# Patient Record
Sex: Female | Born: 1980 | Race: White | Hispanic: No | Marital: Married | Smoking: Never smoker
Health system: Southern US, Community
[De-identification: ages and names within clinical notes are randomized; demographics above are authoritative.]

## PROBLEM LIST (undated history)

## (undated) DIAGNOSIS — N200 Calculus of kidney: Secondary | ICD-10-CM

## (undated) DIAGNOSIS — Z9889 Other specified postprocedural states: Secondary | ICD-10-CM

## (undated) DIAGNOSIS — I10 Essential (primary) hypertension: Secondary | ICD-10-CM

## (undated) DIAGNOSIS — R112 Nausea with vomiting, unspecified: Secondary | ICD-10-CM

## (undated) HISTORY — DX: Essential (primary) hypertension: I10

## (undated) HISTORY — PX: CARPAL TUNNEL RELEASE: SHX101

## (undated) HISTORY — PX: URETHRA SURGERY: SHX824

## (undated) HISTORY — PX: OTHER SURGICAL HISTORY: SHX169

---
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
## 2006-07-29 ENCOUNTER — Encounter: Payer: Self-pay | Admitting: Endocrinology

## 2006-08-02 ENCOUNTER — Encounter: Payer: Self-pay | Admitting: Endocrinology

## 2006-10-04 ENCOUNTER — Ambulatory Visit (HOSPITAL_COMMUNITY): Admission: RE | Admit: 2006-10-04 | Discharge: 2006-10-04 | Payer: Self-pay | Admitting: Family Medicine

## 2006-12-20 ENCOUNTER — Encounter: Payer: Self-pay | Admitting: Endocrinology

## 2006-12-20 ENCOUNTER — Ambulatory Visit: Payer: Self-pay | Admitting: Endocrinology

## 2006-12-20 DIAGNOSIS — G5600 Carpal tunnel syndrome, unspecified upper limb: Secondary | ICD-10-CM | POA: Insufficient documentation

## 2006-12-20 DIAGNOSIS — E282 Polycystic ovarian syndrome: Secondary | ICD-10-CM | POA: Insufficient documentation

## 2006-12-22 ENCOUNTER — Ambulatory Visit: Payer: Self-pay | Admitting: Endocrinology

## 2006-12-22 LAB — CONVERTED CEMR LAB
CO2: 25 meq/L (ref 19–32)
Chloride: 104 meq/L (ref 96–112)
Creatinine, Ser: 0.8 mg/dL (ref 0.4–1.2)
Glucose, Bld: 154 mg/dL — ABNORMAL HIGH (ref 70–99)
Sodium: 138 meq/L (ref 135–145)

## 2006-12-28 ENCOUNTER — Telehealth: Payer: Self-pay | Admitting: Endocrinology

## 2008-03-12 ENCOUNTER — Encounter (HOSPITAL_COMMUNITY): Admission: RE | Admit: 2008-03-12 | Discharge: 2008-04-11 | Payer: Self-pay | Admitting: Rheumatology

## 2008-04-18 ENCOUNTER — Ambulatory Visit (HOSPITAL_COMMUNITY): Admission: RE | Admit: 2008-04-18 | Discharge: 2008-04-18 | Payer: Self-pay | Admitting: Rheumatology

## 2009-04-08 ENCOUNTER — Inpatient Hospital Stay (HOSPITAL_COMMUNITY): Admission: AD | Admit: 2009-04-08 | Discharge: 2009-04-08 | Payer: Self-pay | Admitting: Obstetrics and Gynecology

## 2009-04-11 ENCOUNTER — Inpatient Hospital Stay (HOSPITAL_COMMUNITY): Admission: AD | Admit: 2009-04-11 | Discharge: 2009-04-14 | Payer: Self-pay | Admitting: Obstetrics and Gynecology

## 2010-05-15 LAB — CBC
HCT: 26.5 % — ABNORMAL LOW (ref 36.0–46.0)
HCT: 34.2 % — ABNORMAL LOW (ref 36.0–46.0)
Hemoglobin: 11.6 g/dL — ABNORMAL LOW (ref 12.0–15.0)
MCHC: 33.5 g/dL (ref 30.0–36.0)
MCHC: 33.9 g/dL (ref 30.0–36.0)
MCV: 88.5 fL (ref 78.0–100.0)
MCV: 89.2 fL (ref 78.0–100.0)
RBC: 2.97 MIL/uL — ABNORMAL LOW (ref 3.87–5.11)
RBC: 3.87 MIL/uL (ref 3.87–5.11)
RBC: 4.01 MIL/uL (ref 3.87–5.11)
WBC: 11.6 10*3/uL — ABNORMAL HIGH (ref 4.0–10.5)

## 2010-05-15 LAB — COMPREHENSIVE METABOLIC PANEL
ALT: 16 U/L (ref 0–35)
AST: 17 U/L (ref 0–37)
Albumin: 2.6 g/dL — ABNORMAL LOW (ref 3.5–5.2)
BUN: 10 mg/dL (ref 6–23)
CO2: 22 mEq/L (ref 19–32)
Calcium: 9.4 mg/dL (ref 8.4–10.5)
Calcium: 9.4 mg/dL (ref 8.4–10.5)
Chloride: 105 mEq/L (ref 96–112)
Creatinine, Ser: 0.67 mg/dL (ref 0.4–1.2)
Creatinine, Ser: 0.69 mg/dL (ref 0.4–1.2)
GFR calc Af Amer: >60 mL/min (ref >60)
GFR calc non Af Amer: >60 mL/min (ref >60)
Glucose, Bld: 83 mg/dL (ref 70–99)
Sodium: 134 mEq/L — ABNORMAL LOW (ref 135–145)
Total Bilirubin: 0.5 mg/dL (ref 0.3–1.2)

## 2010-05-15 LAB — LACTATE DEHYDROGENASE: LDH: 133 U/L (ref 94–250)

## 2010-05-15 LAB — URIC ACID: Uric Acid, Serum: 4.2 mg/dL (ref 2.4–7.0)

## 2010-05-15 LAB — RPR: RPR Ser Ql: NONREACTIVE

## 2010-10-13 ENCOUNTER — Other Ambulatory Visit (HOSPITAL_COMMUNITY): Payer: Self-pay | Admitting: Pediatrics

## 2010-10-16 ENCOUNTER — Ambulatory Visit (HOSPITAL_COMMUNITY)
Admission: RE | Admit: 2010-10-16 | Discharge: 2010-10-16 | Disposition: A | Discharge status: Home / Self Care | Payer: BC Managed Care – PPO | Source: Ambulatory Visit | Attending: Pediatrics | Admitting: Pediatrics

## 2010-10-16 DIAGNOSIS — R1011 Right upper quadrant pain: Secondary | ICD-10-CM | POA: Insufficient documentation

## 2010-10-16 DIAGNOSIS — K8020 Calculus of gallbladder without cholecystitis without obstruction: Secondary | ICD-10-CM | POA: Insufficient documentation

## 2010-11-05 ENCOUNTER — Encounter (HOSPITAL_COMMUNITY): Payer: Self-pay

## 2010-11-05 ENCOUNTER — Encounter (HOSPITAL_COMMUNITY)
Admission: RE | Admit: 2010-11-05 | Discharge: 2010-11-05 | Disposition: A | Discharge status: Home / Self Care | Payer: BC Managed Care – PPO | Source: Ambulatory Visit | Attending: General Surgery | Admitting: General Surgery

## 2010-11-05 HISTORY — DX: Nausea with vomiting, unspecified: R11.2

## 2010-11-05 HISTORY — DX: Other specified postprocedural states: Z98.890

## 2010-11-05 LAB — DIFFERENTIAL
Eosinophils Absolute: 0.1 10*3/uL (ref 0.0–0.7)
Eosinophils Relative: 1 % (ref 0–5)
Lymphs Abs: 2.6 10*3/uL (ref 0.7–4.0)
Monocytes Relative: 5 % (ref 3–12)

## 2010-11-05 LAB — CBC
HCT: 38.1 % (ref 36.0–46.0)
Hemoglobin: 12.5 g/dL (ref 12.0–15.0)
MCH: 28 pg (ref 26.0–34.0)
MCV: 85.2 fL (ref 78.0–100.0)
RBC: 4.47 MIL/uL (ref 3.87–5.11)

## 2010-11-05 NOTE — Patient Instructions (Addendum)
20 ELMO SHUMARD  11/05/2010   Your procedure is scheduled on:  11/07/2010  Report to [facility] at 700 AM.  Call this number if you have problems the morning of surgery: [phone]   Remember:   Do not eat food:After Midnight.  Do not drink clear liquids: After Midnight.  Take these medicines the morning of surgery with A SIP OF WATER:none   Do not wear jewelry, make-up or nail polish.  Do not wear lotions, powders, or perfumes. You may wear deodorant.  Do not shave 48 hours prior to surgery.  Do not bring valuables to the hospital.  Contacts, dentures or bridgework may not be worn into surgery.  Leave suitcase in the car. After surgery it may be brought to your room.  For patients admitted to the hospital, checkout time is 11:00 AM the day of discharge.   Patients discharged the day of surgery will not be allowed to drive home.  Name and phone number of your driver: mother in law  Special Instructions: CHG Shower Use Special Wash: 1/2 bottle night before surgery and 1/2 bottle morning of surgery.   Please read over the following fact sheets that you were given: Pain Booklet, MRSA Information, Surgical Site Infection Prevention, Anesthesia Post-op Instructions and Care and Recovery After Surgery PATIENT INSTRUCTIONS POST-ANESTHESIA  IMMEDIATELY FOLLOWING SURGERY:  Do not drive or operate machinery for the first twenty four hours after surgery.  Do not make any important decisions for twenty four hours after surgery or while taking narcotic pain medications or sedatives.  If you develop intractable nausea and vomiting or a severe headache please notify your doctor immediately.  FOLLOW-UP:  Please make an appointment with your surgeon as instructed. You do not need to follow up with anesthesia unless specifically instructed to do so.  WOUND CARE INSTRUCTIONS (if applicable):  Keep a dry clean dressing on the anesthesia/puncture wound site if there is drainage.  Once the wound has quit  draining you may leave it open to air.  Generally you should leave the bandage intact for twenty four hours unless there is drainage.  If the epidural site drains for more than 36-48 hours please call the anesthesia department.  QUESTIONS?:  Please feel free to call your physician or the hospital operator if you have any questions, and they will be happy to assist you.     [facility] Anesthesia Department [address] [place] [phone]

## 2010-11-06 NOTE — H&P (Signed)
  NTS SOAP Note  Vital Signs:  Vitals as of: 11/04/2010: Systolic 158: Diastolic 95: Heart Rate 88: Temp 98.108F: Height 58ft 6.5in: Weight 238Lbs 0 Ounces: Pain Level 6: BMI 38  BMI : 37.84 kg/m2  Subjective: This 77 Years 64 Months old Female presents forof RUQ abd pain.Started having symptoms about a month ago.  Slowly increasing. Occ diffuse but mostly RUQ and epigastric.  Colicky in nature.  Does increase with fatty foods.  Some bloating.  No emesis but occ nausea.  No change with BM although occ has loose stools.  No fevers.  No jaundice.  + family hx, +native [place] ancestry.    Review of Symptoms:  Constitutional:unremarkable Head:unremarkable Eyes:unremarkable Nose/Mouth/Throat:unremarkable Cardiovascular:unremarkable Respiratory:unremarkable Gastrointestinal:unremarkableother than HPI Genitourinary:unremarkable Musculoskeletal:unremarkable Skin:unremarkable Breast:unremarkable Hematolgic/Lymphatic:unremarkable Allergic/Immunologic:unremarkable    Past Medical History:Obtained   Past Medical History  Pregnancy Gravida:  1 Pregnancy Para:  1 Surgical History: urethral surgery, wisdom teeth, carpal tunnel and cupital tunnel release. Medical Problems: none Psychiatric History: none Allergies: NKDA Medications: none   Social History:Obtained   Social History  Preferred Language: English ([place]) Ethnicity: Not Hispanic / Latino Age: 66 Years 4 Months Marital Status:  M Alcohol:  No Recreational drug(s):  No   Smoking Status: Never smoker reviewed on 11/04/2010  Family History:Obtained   Family History  Is there a family history GE:[id number], Cancer.   Medication Allergies:   Allergies Insert Code:   Objective Information: General:Well appearing, well nourished in no distress. obese Skin:no rash or prominent lesions Head:Atraumatic; no masses; no abnormalities Eyes:conjunctiva  clear, EOM intact, PERRL Mouth:Mucous membranes moist, no mucosal lesions. Neck:Supple without lymphadenopathy.  Heart:RRR, no murmur or gallop.  Normal S1, S2.  No S3, S4.  Lungs:CTA bilaterally, no wheezes, rhonchi, rales.  Breathing unlabored. Abdomen:Soft, ND, no HSM, no masses.Mild epigastric tenderness.  No peritoneal signs.  No classic Murphy's. Extremities:No deformities, clubbing, cyanosis, or edema.   Assessment:  Diagnosis &amp; Procedure: DiagnosisCode: 574.10, ProcedureCode: 44010,   Orders:    Plan: Disscussed biliary symptoms.  Surgery discussed.  Will schedule at her convenience.   Patient Education:Alternative treatments to surgery were discussed with patient (and family).Risks and benefits  of procedure were fully explained to the patient (and family) who gave informed consent. Patient/family questions were addressed.  Follow-up:Pending Surgery                         Fabio Bering MD 11/04/2010 12:49 PM

## 2010-11-07 ENCOUNTER — Other Ambulatory Visit: Payer: Self-pay | Admitting: General Surgery

## 2010-11-07 ENCOUNTER — Encounter (HOSPITAL_COMMUNITY): Payer: Self-pay | Admitting: Anesthesiology

## 2010-11-07 ENCOUNTER — Encounter (HOSPITAL_COMMUNITY)
Admission: RE | Disposition: A | Discharge status: Home / Self Care | Payer: Self-pay | Source: Ambulatory Visit | Attending: General Surgery

## 2010-11-07 ENCOUNTER — Ambulatory Visit (HOSPITAL_COMMUNITY): Payer: BC Managed Care – PPO | Admitting: Anesthesiology

## 2010-11-07 ENCOUNTER — Ambulatory Visit (HOSPITAL_COMMUNITY)
Admission: RE | Admit: 2010-11-07 | Discharge: 2010-11-07 | Disposition: A | Discharge status: Home / Self Care | Payer: BC Managed Care – PPO | Source: Ambulatory Visit | Attending: General Surgery | Admitting: General Surgery

## 2010-11-07 ENCOUNTER — Encounter (HOSPITAL_COMMUNITY): Payer: Self-pay | Admitting: *Deleted

## 2010-11-07 DIAGNOSIS — R1013 Epigastric pain: Secondary | ICD-10-CM | POA: Insufficient documentation

## 2010-11-07 DIAGNOSIS — Z01812 Encounter for preprocedural laboratory examination: Secondary | ICD-10-CM | POA: Insufficient documentation

## 2010-11-07 DIAGNOSIS — K8020 Calculus of gallbladder without cholecystitis without obstruction: Secondary | ICD-10-CM | POA: Insufficient documentation

## 2010-11-07 DIAGNOSIS — R1011 Right upper quadrant pain: Secondary | ICD-10-CM | POA: Insufficient documentation

## 2010-11-07 HISTORY — PX: CHOLECYSTECTOMY: SHX55

## 2010-11-07 SURGERY — LAPAROSCOPIC CHOLECYSTECTOMY
Anesthesia: General | Site: Abdomen | Wound class: Clean Contaminated

## 2010-11-07 MED ORDER — PROMETHAZINE HCL 25 MG/ML IJ SOLN
6.2500 mg | Freq: Once | INTRAMUSCULAR | Status: AC
  Administered 2010-11-07: 6.25 mg via INTRAVENOUS

## 2010-11-07 MED ORDER — FENTANYL CITRATE 0.05 MG/ML IJ SOLN
INTRAMUSCULAR | Status: AC
  Filled 2010-11-07: qty 2

## 2010-11-07 MED ORDER — FENTANYL CITRATE 0.05 MG/ML IJ SOLN
INTRAMUSCULAR | Status: DC | PRN
  Administered 2010-11-07: 150 ug via INTRAVENOUS
  Administered 2010-11-07 (×2): 100 ug via INTRAVENOUS

## 2010-11-07 MED ORDER — ACETAMINOPHEN 325 MG PO TABS: 325.0000 mg | ORAL_TABLET | ORAL | Status: DC | PRN

## 2010-11-07 MED ORDER — ONDANSETRON HCL 4 MG/2ML IJ SOLN
4.0000 mg | Freq: Once | INTRAMUSCULAR | Status: AC | PRN
  Administered 2010-11-07: 4 mg via INTRAVENOUS

## 2010-11-07 MED ORDER — FENTANYL CITRATE 0.05 MG/ML IJ SOLN
INTRAMUSCULAR | Status: AC
  Administered 2010-11-07: 50 ug via INTRAVENOUS
  Filled 2010-11-07: qty 2

## 2010-11-07 MED ORDER — CEFAZOLIN SODIUM 1-5 GM-% IV SOLN
INTRAVENOUS | Status: AC
  Filled 2010-11-07: qty 50

## 2010-11-07 MED ORDER — HEMOSTATIC AGENTS (NO CHARGE) OPTIME: TOPICAL | Status: DC | PRN

## 2010-11-07 MED ORDER — MIDAZOLAM HCL 2 MG/2ML IJ SOLN
INTRAMUSCULAR | Status: AC
  Filled 2010-11-07: qty 2

## 2010-11-07 MED ORDER — HYDROCODONE-ACETAMINOPHEN 5-325 MG PO TABS: 1.0000 | ORAL_TABLET | ORAL | Status: AC | PRN

## 2010-11-07 MED ORDER — LACTATED RINGERS IV SOLN
INTRAVENOUS | Status: DC | PRN
  Administered 2010-11-07: 08:00:00 via INTRAVENOUS

## 2010-11-07 MED ORDER — SODIUM CHLORIDE 0.9 % IR SOLN
Status: DC | PRN
  Administered 2010-11-07: 1000 mL

## 2010-11-07 MED ORDER — ONDANSETRON HCL 4 MG/2ML IJ SOLN
4.0000 mg | Freq: Once | INTRAMUSCULAR | Status: AC
  Administered 2010-11-07: 4 mg via INTRAVENOUS

## 2010-11-07 MED ORDER — PROMETHAZINE HCL 25 MG/ML IJ SOLN
INTRAMUSCULAR | Status: AC
  Administered 2010-11-07: 6.25 mg via INTRAVENOUS
  Filled 2010-11-07: qty 1

## 2010-11-07 MED ORDER — CEFAZOLIN SODIUM 1-5 GM-% IV SOLN: 1.0000 g | INTRAVENOUS | Status: DC

## 2010-11-07 MED ORDER — ROCURONIUM BROMIDE 100 MG/10ML IV SOLN
INTRAVENOUS | Status: DC | PRN
  Administered 2010-11-07: 35 mg via INTRAVENOUS

## 2010-11-07 MED ORDER — CELECOXIB 100 MG PO CAPS
ORAL_CAPSULE | ORAL | Status: AC
  Administered 2010-11-07: 400 mg via ORAL
  Filled 2010-11-07: qty 4

## 2010-11-07 MED ORDER — ENOXAPARIN SODIUM 40 MG/0.4ML ~~LOC~~ SOLN
40.0000 mg | Freq: Once | SUBCUTANEOUS | Status: AC
  Administered 2010-11-07: 40 mg via SUBCUTANEOUS

## 2010-11-07 MED ORDER — BUPIVACAINE HCL (PF) 0.5 % IJ SOLN
INTRAMUSCULAR | Status: DC | PRN
  Administered 2010-11-07: 10 mL

## 2010-11-07 MED ORDER — CEFAZOLIN SODIUM 1-5 GM-% IV SOLN
INTRAVENOUS | Status: DC | PRN
  Administered 2010-11-07: 1 g via INTRAVENOUS

## 2010-11-07 MED ORDER — LACTATED RINGERS IV SOLN: INTRAVENOUS | Status: DC

## 2010-11-07 MED ORDER — GLYCOPYRROLATE 0.2 MG/ML IJ SOLN
0.2000 mg | Freq: Once | INTRAMUSCULAR | Status: AC | PRN
  Administered 2010-11-07: 0.2 mg via INTRAVENOUS

## 2010-11-07 MED ORDER — FENTANYL CITRATE 0.05 MG/ML IJ SOLN
INTRAMUSCULAR | Status: AC
  Administered 2010-11-07: 50 ug via INTRAVENOUS
  Filled 2010-11-07: qty 5

## 2010-11-07 MED ORDER — GLYCOPYRROLATE 0.2 MG/ML IJ SOLN
INTRAMUSCULAR | Status: AC
  Filled 2010-11-07: qty 1

## 2010-11-07 MED ORDER — FENTANYL CITRATE 0.05 MG/ML IJ SOLN
25.0000 ug | INTRAMUSCULAR | Status: DC | PRN
  Administered 2010-11-07 (×3): 50 ug via INTRAVENOUS

## 2010-11-07 MED ORDER — CELECOXIB 100 MG PO CAPS
400.0000 mg | ORAL_CAPSULE | Freq: Every day | ORAL | Status: AC
  Administered 2010-11-07: 400 mg via ORAL

## 2010-11-07 MED ORDER — GLYCOPYRROLATE 0.2 MG/ML IJ SOLN
0.2000 mg | Freq: Once | INTRAMUSCULAR | Status: AC
  Administered 2010-11-07: 0.2 mg via INTRAVENOUS

## 2010-11-07 MED ORDER — LACTATED RINGERS IV SOLN
INTRAVENOUS | Status: DC
  Administered 2010-11-07: 08:00:00 via INTRAVENOUS

## 2010-11-07 MED ORDER — LIDOCAINE HCL 1 % IJ SOLN
INTRAMUSCULAR | Status: DC | PRN
  Administered 2010-11-07: 50 mg via INTRADERMAL

## 2010-11-07 MED ORDER — ONDANSETRON HCL 4 MG/2ML IJ SOLN
INTRAMUSCULAR | Status: AC
  Administered 2010-11-07: 4 mg via INTRAVENOUS
  Filled 2010-11-07: qty 2

## 2010-11-07 MED ORDER — PROPOFOL 10 MG/ML IV EMUL
INTRAVENOUS | Status: DC | PRN
  Administered 2010-11-07: 150 mg via INTRAVENOUS

## 2010-11-07 MED ORDER — ONDANSETRON HCL 4 MG/2ML IJ SOLN
INTRAMUSCULAR | Status: AC
  Filled 2010-11-07: qty 2

## 2010-11-07 MED ORDER — GLYCOPYRROLATE 0.2 MG/ML IJ SOLN
INTRAMUSCULAR | Status: DC | PRN
  Administered 2010-11-07: .4 mg via INTRAVENOUS

## 2010-11-07 MED ORDER — MIDAZOLAM HCL 2 MG/2ML IJ SOLN
1.0000 mg | INTRAMUSCULAR | Status: DC | PRN
  Administered 2010-11-07: 2 mg via INTRAVENOUS

## 2010-11-07 MED ORDER — MIDAZOLAM HCL 5 MG/5ML IJ SOLN
INTRAMUSCULAR | Status: DC | PRN
  Administered 2010-11-07: 2 mg via INTRAVENOUS

## 2010-11-07 MED ORDER — NEOSTIGMINE METHYLSULFATE 1 MG/ML IJ SOLN
INTRAMUSCULAR | Status: DC | PRN
  Administered 2010-11-07: 3 mg via INTRAMUSCULAR

## 2010-11-07 MED ORDER — BUPIVACAINE HCL (PF) 0.5 % IJ SOLN
INTRAMUSCULAR | Status: AC
  Filled 2010-11-07: qty 30

## 2010-11-07 MED ORDER — GLYCOPYRROLATE 0.2 MG/ML IJ SOLN
INTRAMUSCULAR | Status: AC
  Administered 2010-11-07: 0.2 mg via INTRAVENOUS
  Filled 2010-11-07: qty 1

## 2010-11-07 SURGICAL SUPPLY — 36 items
APPLIER CLIP UNV 5X34 EPIX (ENDOMECHANICALS) ×2 IMPLANT
BAG HAMPER (MISCELLANEOUS) ×2 IMPLANT
BENZOIN TINCTURE PRP APPL 2/3 (GAUZE/BANDAGES/DRESSINGS) ×2 IMPLANT
CLOTH BEACON ORANGE TIMEOUT ST (SAFETY) ×2 IMPLANT
COVER LIGHT HANDLE STERIS (MISCELLANEOUS) ×4 IMPLANT
DECANTER SPIKE VIAL GLASS SM (MISCELLANEOUS) ×2 IMPLANT
DEVICE TROCAR PUNCTURE CLOSURE (ENDOMECHANICALS) ×2 IMPLANT
DURAPREP 26ML APPLICATOR (WOUND CARE) ×2 IMPLANT
ELECT REM PT RETURN 9FT ADLT (ELECTROSURGICAL) ×2
ELECTRODE REM PT RTRN 9FT ADLT (ELECTROSURGICAL) ×1 IMPLANT
FILTER SMOKE EVAC LAPAROSHD (FILTER) ×2 IMPLANT
FORMALIN 10 PREFIL 120ML (MISCELLANEOUS) ×2 IMPLANT
GLOVE BIOGEL PI IND STRL 7.5 (GLOVE) ×1 IMPLANT
GLOVE BIOGEL PI INDICATOR 7.5 (GLOVE) ×1
GLOVE ECLIPSE 6.5 STRL STRAW (GLOVE) ×2 IMPLANT
GLOVE ECLIPSE 7.0 STRL STRAW (GLOVE) ×4 IMPLANT
GLOVE EXAM NITRILE MD LF STRL (GLOVE) ×2 IMPLANT
GLOVE INDICATOR 7.0 STRL GRN (GLOVE) ×2 IMPLANT
GLOVE INDICATOR 7.5 STRL GRN (GLOVE) ×2 IMPLANT
GOWN BRE IMP SLV AUR XL STRL (GOWN DISPOSABLE) ×6 IMPLANT
HEMOSTAT SNOW SURGICEL 2X4 (HEMOSTASIS) ×2 IMPLANT
INST SET LAPROSCOPIC AP (KITS) ×2 IMPLANT
IV NS IRRIG 3000ML ARTHROMATIC (IV SOLUTION) ×2 IMPLANT
KIT ROOM TURNOVER APOR (KITS) ×2 IMPLANT
KIT TROCAR LAP CHOLE (TROCAR) ×2 IMPLANT
MANIFOLD NEPTUNE II (INSTRUMENTS) ×2 IMPLANT
PACK LAP CHOLE LZT030E (CUSTOM PROCEDURE TRAY) ×2 IMPLANT
PAD ARMBOARD 7.5X6 YLW CONV (MISCELLANEOUS) ×2 IMPLANT
POUCH SPECIMEN RETRIEVAL 10MM (ENDOMECHANICALS) ×2 IMPLANT
SET BASIN LINEN APH (SET/KITS/TRAYS/PACK) ×2 IMPLANT
SET TUBE IRRIG SUCTION NO TIP (IRRIGATION / IRRIGATOR) ×2 IMPLANT
SLEEVE Z-THREAD 5X100MM (TROCAR) ×2 IMPLANT
STRIP CLOSURE SKIN 1/2X4 (GAUZE/BANDAGES/DRESSINGS) ×2 IMPLANT
SUT MNCRL AB 4-0 PS2 18 (SUTURE) ×4 IMPLANT
SUT VIC AB 2-0 CT2 27 (SUTURE) ×2 IMPLANT
WARMER LAPAROSCOPE (MISCELLANEOUS) ×2 IMPLANT

## 2010-11-07 NOTE — Interval H&P Note (Signed)
History and Physical Interval Note:   11/07/2010   8:51 AM   Emily Colon  has presented today for surgery, with the diagnosis of Cholelithiasis [574.20]  The various methods of treatment have been discussed with the patient and family. After consideration of risks, benefits and other options for treatment, the patient has consented to  Procedure(s): LAPAROSCOPIC CHOLECYSTECTOMY as a surgical intervention .  I have reviewed the patients' chart and labs.  Questions were answered to the patient's satisfaction.     Fabio Bering  MD

## 2010-11-07 NOTE — Addendum Note (Signed)
Addendum  created 11/07/10 1303 by Roselie Awkward, MD   Modules edited:Orders

## 2010-11-07 NOTE — Anesthesia Procedure Notes (Addendum)
Procedure Name: Intubation Date/Time: 11/07/2010 9:18 AM Performed by: Despina Hidden Pre-anesthesia Checklist: Patient identified, Patient being monitored, Timeout performed, Emergency Drugs available and Suction available Patient Re-evaluated:Patient Re-evaluated prior to inductionOxygen Delivery Method: Circle System Utilized Preoxygenation: Pre-oxygenation with 100% oxygen Intubation Type: IV induction Ventilation: Mask ventilation without difficulty Laryngoscope Size: Mac and 3 Grade View: Grade I Tube type: Oral Airway Equipment and Method: stylet Placement Confirmation: ETT inserted through vocal cords under direct vision,  breath sounds checked- equal and bilateral and positive ETCO2 Dental Injury: Teeth and Oropharynx as per pre-operative assessment

## 2010-11-07 NOTE — Op Note (Signed)
Patient:  Emily Colon  DOB:  Dec 08, 1980  MRN:  [id number]   Preop Diagnosis:  Cholelithiasis   Postop Diagnosis:  The same  Procedure:  Laparoscopic cholecystectomy  Surgeon:  Dr. Tilford Pillar  Anes:  General endotracheal  Indications:  Patient is a 30 year old female presented my office with a history right upper quadrant abdominal pain workup was consistent for cholelithiasis and biliary etiology. Risks benefits alternatives of a laparoscopic possible open cholecystectomy were discussed at length with the patient including but not limited to risk of bleeding, infection, bile leak, small bowel injury, common bile duct injury, intraoperative cardiac and pulmonary events. Patient's questions and concerns were addressed the patient was consented for the planned procedure.  Procedure note:  Patient is taken to the OR was placed in a supine position on the OR table. This point general anesthetic was administered and was patient was asleep she was endotracheally intubated by the nurse anesthetist. At this point her abdomen is prepped with DuraPrep solution and draped in standard fashion. A stab incision was created supraumbilically with 11 blade scalpel additional dissection down to subcuticular tissues carried out using a Coker clamp was utilized to grasp the anterior abdominal wall fascia and lift this anteriorly. A Veress needle was inserted saline drop test was utilized to confirm intraperitoneal placement and then pneumoperitoneum was initiated. Once sufficient pneumoperitoneum was obtained an 11 mm trocar was inserted over a laparoscope allowing visualization the trocar entering into the peritoneal cavity. At this point the inner cannula was removed the laparoscope was reinserted there is no evidence a Veress or trocar placement injury. At this point the remaining trochars replaced with a 5 mm in the epigastrium a 5 mm trocar in the midline and a 5 mm trocar in the right lateral abdominal  wall. Patient is positioned into a reverse Trendelenburg left lateral decubitus position. The fundus of the gallbladder was grasped and lifted up and over the right lobe the liver. Blunt peritoneal dissection is carried out using a [place] dissector to strip the peritoneum off the infundibulum exposing the cystic duct and cystic artery is or entering into the infundibulum. A window was created behind both the structures. 3 endoclips were placed proximally one distally and the cystic duct and the cystic duct was divided between 2 most clips. Similarly 2 endoclips placed proximally one distally and the cystic artery and the cystic artery was divided between 2 most distal clips. At this point electrocautery was utilized to dissect the gallbladder free from the gallbladder fossa. Once it is free is placed into the Endo Catch bag and placed into the right lower quadrant. Inspection of the gallbladder fossa demonstrated excellent hemostasis having been attainable after cautery. The endoclips were inspected there is no evidence of any bleeding or bile leak. At this point attention was turned to closure.  The 10 mm scope is exchanged for a 5 mm scope. A Endo Close suture passing device utilized to pass a 2-0 Vicryl through the umbilical trocar site. With this suture in place the gallbladder was retrieved and was removed through the umbilical trocar site and intact Endo Catch bag. To note some blunt dilatation was required to adequately enlarged the umbilical trocar site to remove the gallbladder. At this point the pneumoperitoneum was evacuated. The trochars were removed. The Vicryl sutures secured. Local anesthetic is instilled. A 4-0 Monocryl utilized to reapproximate the skin edges at all 4 trocar sites. The skin was washed dried moist dry towel. Benzoin is applied around the  incisions. And Steri-Strips are placed. The drapes were removed. The patient was allowed to come out of general anesthetic is transferred to the  PACU in stable condition. At the conclusion of procedure all instrument sponge and needle counts are correct. Patient tolerated procedure extremely well.  Complications:  None  EBL:  Scant  Specimen:  Gallbladder

## 2010-11-07 NOTE — Anesthesia Postprocedure Evaluation (Signed)
  Anesthesia Post-op Note  Patient: Emily Colon  Procedure(s) Performed:  LAPAROSCOPIC CHOLECYSTECTOMY  Patient Location: PACU  Anesthesia Type: General  Level of Consciousness: awake, alert , oriented and patient cooperative  Airway and Oxygen Therapy: Patient Spontanous Breathing and Patient connected to face mask oxygen  Post-op Pain: 2 /10, mild  Post-op Assessment: Post-op Vital signs reviewed, Patient's Cardiovascular Status Stable, Respiratory Function Stable, Patent Airway, No signs of Nausea or vomiting and Pain level controlled  Post-op Vital Signs: Reviewed and stable  Complications: No apparent anesthesia complications

## 2010-11-07 NOTE — Transfer of Care (Signed)
Immediate Anesthesia Transfer of Care Note  Patient: Emily Colon  Procedure(s) Performed:  LAPAROSCOPIC CHOLECYSTECTOMY  Patient Location: PACU  Anesthesia Type: General  Level of Consciousness: awake, alert , oriented and patient cooperative  Airway & Oxygen Therapy: Patient Spontanous Breathing and Patient connected to face mask oxygen  Post-op Assessment: Report given to PACU RN, Post -op Vital signs reviewed and stable, Patient moving all extremities and Patient moving all extremities X 4  Post vital signs: Reviewed and stable  Complications: No apparent anesthesia complications

## 2010-11-07 NOTE — Anesthesia Preprocedure Evaluation (Signed)
Anesthesia Evaluation  Name, MR# and DOB Patient awake  General Assessment Comment  Reviewed: Allergy & Precautions, H&P , NPO status , Patient's Chart, lab work & pertinent test results  History of Anesthesia Complications (+) PONV  Airway Mallampati: II TM Distance: >3 FB Neck ROM: Full    Dental No notable dental hx.    Pulmonary    pulmonary exam normalPulmonary Exam Normal     Cardiovascular Regular Normal    Neuro/Psych  Neuromuscular disease   GI/Hepatic/Renal        GERD Medicated     Endo/Other  (+)   Morbid obesity  Abdominal Normal abdominal exam  (+)   Musculoskeletal   Hematology   Peds  Reproductive/Obstetrics negative OB ROS    Anesthesia Other Findings             Anesthesia Physical Anesthesia Plan  ASA: II  Anesthesia Plan: General   Post-op Pain Management:    Induction: Intravenous, Rapid sequence and Cricoid pressure planned  Airway Management Planned: Oral ETT  Additional Equipment:   Intra-op Plan:   Post-operative Plan: Extubation in OR  Informed Consent: I have reviewed the patients History and Physical, chart, labs and discussed the procedure including the risks, benefits and alternatives for the proposed anesthesia with the patient or authorized representative who has indicated his/her understanding and acceptance.     Plan Discussed with: CRNA  Anesthesia Plan Comments:         Anesthesia Quick Evaluation

## 2010-11-13 ENCOUNTER — Encounter (HOSPITAL_COMMUNITY): Payer: Self-pay | Admitting: General Surgery

## 2012-03-17 ENCOUNTER — Other Ambulatory Visit: Payer: Self-pay | Admitting: Obstetrics and Gynecology

## 2012-03-17 DIAGNOSIS — N644 Mastodynia: Secondary | ICD-10-CM

## 2012-03-28 ENCOUNTER — Ambulatory Visit
Admission: RE | Admit: 2012-03-28 | Discharge: 2012-03-28 | Disposition: A | Discharge status: Home / Self Care | Payer: BC Managed Care – PPO | Source: Ambulatory Visit | Attending: Obstetrics and Gynecology | Admitting: Obstetrics and Gynecology

## 2012-03-28 DIAGNOSIS — N644 Mastodynia: Secondary | ICD-10-CM

## 2012-08-11 ENCOUNTER — Encounter: Payer: Self-pay | Admitting: Pediatrics

## 2012-12-29 ENCOUNTER — Other Ambulatory Visit: Payer: Self-pay

## 2014-03-01 ENCOUNTER — Other Ambulatory Visit: Payer: Self-pay | Admitting: Rheumatology

## 2014-03-01 ENCOUNTER — Ambulatory Visit
Admission: RE | Admit: 2014-03-01 | Discharge: 2014-03-01 | Disposition: A | Discharge status: Home / Self Care | Payer: BC Managed Care – PPO | Source: Ambulatory Visit | Attending: Rheumatology | Admitting: Rheumatology

## 2014-03-01 DIAGNOSIS — M545 Low back pain: Secondary | ICD-10-CM

## 2014-09-10 ENCOUNTER — Other Ambulatory Visit: Payer: Self-pay | Admitting: Obstetrics and Gynecology

## 2014-09-11 LAB — CYTOLOGY - PAP

## 2016-03-25 ENCOUNTER — Telehealth: Payer: Self-pay

## 2016-03-25 NOTE — Telephone Encounter (Signed)
Pt was referred by Dr. Dwana MelenaZack Hall for colonoscopy ( family hx of colon cancer). LFTs elevated ( AST 37 and ALT and ALT 65).  LMOM to call and schedule an OV prior to scheduling a colonoscopy.

## 2016-03-27 NOTE — Telephone Encounter (Signed)
PT has OV appt on 04/15/2016 at 8:00 Am with Wynne DustEric Gill, NP.

## 2016-04-15 ENCOUNTER — Other Ambulatory Visit: Payer: Self-pay

## 2016-04-15 ENCOUNTER — Encounter: Payer: Self-pay | Admitting: Nurse Practitioner

## 2016-04-15 ENCOUNTER — Ambulatory Visit (INDEPENDENT_AMBULATORY_CARE_PROVIDER_SITE_OTHER): Payer: BC Managed Care – PPO | Admitting: Nurse Practitioner

## 2016-04-15 DIAGNOSIS — R945 Abnormal results of liver function studies: Principal | ICD-10-CM

## 2016-04-15 DIAGNOSIS — Z8371 Family history of colonic polyps: Secondary | ICD-10-CM

## 2016-04-15 DIAGNOSIS — K625 Hemorrhage of anus and rectum: Secondary | ICD-10-CM | POA: Diagnosis not present

## 2016-04-15 DIAGNOSIS — Z83719 Family history of colon polyps, unspecified: Secondary | ICD-10-CM

## 2016-04-15 DIAGNOSIS — R7989 Other specified abnormal findings of blood chemistry: Secondary | ICD-10-CM

## 2016-04-15 LAB — COMPREHENSIVE METABOLIC PANEL
ALBUMIN: 3.9 g/dL (ref 3.6–5.1)
ALK PHOS: 54 U/L (ref 33–115)
ALT: 64 U/L — AB (ref 6–29)
AST: 39 U/L — ABNORMAL HIGH (ref 10–30)
BUN: 13 mg/dL (ref 7–25)
CO2: 25 mmol/L (ref 20–31)
CREATININE: 0.83 mg/dL (ref 0.50–1.10)
Calcium: 9.3 mg/dL (ref 8.6–10.2)
Chloride: 103 mmol/L (ref 98–110)
Glucose, Bld: 99 mg/dL (ref 65–99)
POTASSIUM: 4.4 mmol/L (ref 3.5–5.3)
Sodium: 138 mmol/L (ref 135–146)
TOTAL PROTEIN: 7 g/dL (ref 6.1–8.1)
Total Bilirubin: 0.5 mg/dL (ref 0.2–1.2)

## 2016-04-15 MED ORDER — PEG 3350-KCL-NA BICARB-NACL 420 G PO SOLR: 4000.0000 mL | ORAL | 0 refills | Status: DC

## 2016-04-15 NOTE — Assessment & Plan Note (Signed)
The patient has mild elevation in LFTs including AST/ALT of 37/65. She also has hypertension and borderline diabetes with a hemoglobin A1c of 5.9. She is also morbidly obese. Most likely etiology is fatty infiltration of the liver. However, I will recheck CMP as well as hepatitis viruses. May consider autoimmune workup due to family history of "immune issues". I will also do a right upper quadrant ultrasound to evaluate for any significant liver changes.

## 2016-04-15 NOTE — Patient Instructions (Signed)
1. Have your labs drawn when you're able to. 2. We will help you schedule your abdominal ultrasound of your liver. 3. We will schedule your colonoscopy procedure for you. 4. Return for follow-up in 2 months to discuss results and any next steps.

## 2016-04-15 NOTE — Progress Notes (Addendum)
REVIEWED-NO ADDITIONAL RECOMMENDATIONS.Marland Kitchen  Primary Care Physician:  Dwana Melena, MD Primary Gastroenterologist:  Dr. Darrick Penna  Chief Complaint  Patient presents with  . Colonoscopy    HPI:   Emily Colon is a 36 y.o. female who presents on referral from primary care to consider colonoscopy as well as for elevated LFTs. PCP notes reviewed. Labs included reviewed. AST/ALT with minimal elevation of 37/65, hemoglobin A1c also borderline diabetic at 5.9.  Today she states she's doing well overall. Her sister recently had a colonoscopy and her sister had a particularly large polyp which was "precancerous" and her sister's GI recommended all siblings undergo colonoscopy now for initial screening/high risk. Her dad also had several polyps removed. Her sister also has a benign liver cyst (per MRI). Has been told she has a gluten allergy (per serology) and has been gluten free for a couple years but difficult when eating out and will get some abdominal pain, bloating with this. Has intermittent left-sided pain, intermittent, ranges from irritating to "more noticeable than others" and typically resolves on its own. Rare RUQ irritation s/p cholecystectomy. Tends to have more diarrhea after cholecystectomy. Denies persistent N/V. Occasional rectal bleeding about "once in a blue moon." Is also having esophageal burning and was given Nexium for two weeks which helped and is due for follow-up on this. Denies dysphagia. Denies melena, fever, chills, unintentional weight loss. Denies chest pain, dyspnea, dizziness, lightheadedness, syncope, near syncope. Denies any other upper or lower GI symptoms.  Denies drug use/injection drug use. Takes Aleve once every couple weeks. No ASA powders.  Past Medical History:  Diagnosis Date  . Hypertension   . PONV (postoperative nausea and vomiting)     Past Surgical History:  Procedure Laterality Date  . CARPAL TUNNEL RELEASE  2005 and 2006   bilateral    also cubital  tunnel  . CHOLECYSTECTOMY  11/07/2010   Procedure: LAPAROSCOPIC CHOLECYSTECTOMY;  Surgeon: Fabio Bering;  Location: [facility];  Service: General;  Laterality: N/A;  . URETHRA SURGERY  age 4  . WISDOM TOOTH EXTRACTION  2001    Current Outpatient Prescriptions  Medication Sig Dispense Refill  . CAMILA 0.35 MG tablet     . cyclobenzaprine (FLEXERIL) 10 MG tablet Take 10 mg by mouth 3 (three) times daily as needed for muscle spasms.    Marland Kitchen losartan (COZAAR) 100 MG tablet Take 100 mg by mouth daily.    . nebivolol (BYSTOLIC) 5 MG tablet Take 5 mg by mouth daily.     No current facility-administered medications for this visit.     Allergies as of 04/15/2016  . (No Known Allergies)    Family History  Problem Relation Age of Onset  . Anesthesia problems Neg Hx   . Hypotension Neg Hx   . Malignant hyperthermia Neg Hx   . Pseudochol deficiency Neg Hx   . Colon cancer Neg Hx     Social History   Social History  . Marital status: Married    Spouse name: N/A  . Number of children: N/A  . Years of education: N/A   Occupational History  . Not on file.   Social History Main Topics  . Smoking status: Never Smoker  . Smokeless tobacco: Never Used  . Alcohol use No  . Drug use: No  . Sexual activity: Yes    Birth control/ protection: Implant   Other Topics Concern  . Not on file   Social History Narrative  . No narrative on file  Review of Systems: Complete ROS negative except as per HPI.    Physical Exam: BP (!) 161/107   Pulse 73   Temp 97.8 F (36.6 C) (Oral)   Ht 5\' 7"  (1.702 m)   Wt 275 lb (124.7 kg)   LMP 03/25/2016   BMI 43.07 kg/m  General:   Obese female. Alert and oriented. Pleasant and cooperative. Well-nourished and well-developed.  Head:  Normocephalic and atraumatic. Eyes:  Without icterus, sclera clear and conjunctiva pink.  Ears:  Normal auditory acuity. Cardiovascular:  S1, S2 present without murmurs appreciated. Extremities without clubbing or  edema. Respiratory:  Clear to auscultation bilaterally. No wheezes, rales, or rhonchi. No distress.  Gastrointestinal:  +BS, rounded but soft, and non-distended. Mild left-sided TTP and epigastric TTP. No HSM noted. No guarding or rebound. No masses appreciated.  Rectal:  Deferred  Musculoskalatal:  Symmetrical without gross deformities. Neurologic:  Alert and oriented x4;  grossly normal neurologically. Psych:  Alert and cooperative. Normal mood and affect. Heme/Lymph/Immune: No excessive bruising noted.    04/15/2016 8:43 AM   Disclaimer: This note was dictated with voice recognition software. Similar sounding words can inadvertently be transcribed and may not be corrected upon review.

## 2016-04-15 NOTE — Assessment & Plan Note (Signed)
The patient notes occasional rectal bleeding "[facility] "which is typically on the stool and in the water. Family history of colon polyps as per above. We will proceed with colonoscopy as per above.

## 2016-04-15 NOTE — Progress Notes (Signed)
cc'ed to pcp °

## 2016-04-15 NOTE — Assessment & Plan Note (Signed)
The patient states her sister recently had a moderate to large size polyp removed which is deemed precancerous. Her sisters gastroenterologist recommended all siblings be evaluated with a colonoscopy. Additionally, her father had several polyps. She does have some left-sided abdominal tenderness and rectal bleeding as well. Because of rectal bleeding and family history we will proceed with colonoscopy at this time.  Proceed with colonoscopy with Dr. Darrick PennaFields in the near future. The risks, benefits, and alternatives have been discussed in detail with the patient. They state understanding and desire to proceed.   The patient is not on any anticoagulants, anxiolytics, chronic pain medications, or antidepressants. Conscious sedation should be adequate for her procedure.

## 2016-04-16 LAB — HEPATITIS C ANTIBODY: HCV Ab: NEGATIVE

## 2016-04-16 LAB — HEPATITIS B CORE ANTIBODY, TOTAL: HEP B C TOTAL AB: NONREACTIVE

## 2016-04-16 LAB — HEPATITIS B SURFACE ANTIBODY,QUALITATIVE: HEP B S AB: POSITIVE — AB

## 2016-04-16 LAB — HEPATITIS A ANTIBODY, TOTAL: HEP A TOTAL AB: NONREACTIVE

## 2016-04-16 LAB — HEPATITIS B SURFACE ANTIGEN: HEP B S AG: NEGATIVE

## 2016-04-22 ENCOUNTER — Ambulatory Visit (HOSPITAL_COMMUNITY)
Admission: RE | Admit: 2016-04-22 | Discharge: 2016-04-22 | Disposition: A | Discharge status: Home / Self Care | Payer: BC Managed Care – PPO | Source: Ambulatory Visit | Attending: Nurse Practitioner | Admitting: Nurse Practitioner

## 2016-04-22 DIAGNOSIS — R7989 Other specified abnormal findings of blood chemistry: Secondary | ICD-10-CM | POA: Diagnosis present

## 2016-04-22 DIAGNOSIS — R945 Abnormal results of liver function studies: Secondary | ICD-10-CM

## 2016-04-22 DIAGNOSIS — Z9049 Acquired absence of other specified parts of digestive tract: Secondary | ICD-10-CM | POA: Insufficient documentation

## 2016-04-24 ENCOUNTER — Telehealth: Payer: Self-pay | Admitting: Gastroenterology

## 2016-04-24 NOTE — Telephone Encounter (Signed)
PATIENT HAS A QUESTION ABOUT HER BLOODWORK THAT SHE CAN SEE ON MY CHART. [phone]

## 2016-04-27 NOTE — Progress Notes (Signed)
PT is aware of results.  

## 2016-04-27 NOTE — Telephone Encounter (Signed)
LMOM to call.

## 2016-04-28 NOTE — Telephone Encounter (Signed)
Pt is aware of her results. Also, she wanted to make sure she is still scheduled for the colonoscopy on Thursday, since it does not show up in appts.

## 2016-04-30 ENCOUNTER — Ambulatory Visit (HOSPITAL_COMMUNITY)
Admission: RE | Admit: 2016-04-30 | Discharge: 2016-04-30 | Disposition: A | Discharge status: Home / Self Care | Payer: BC Managed Care – PPO | Source: Ambulatory Visit | Attending: Gastroenterology | Admitting: Gastroenterology

## 2016-04-30 ENCOUNTER — Encounter (HOSPITAL_COMMUNITY): Payer: Self-pay | Admitting: *Deleted

## 2016-04-30 ENCOUNTER — Encounter (HOSPITAL_COMMUNITY)
Admission: RE | Disposition: A | Discharge status: Home / Self Care | Payer: Self-pay | Source: Ambulatory Visit | Attending: Gastroenterology

## 2016-04-30 DIAGNOSIS — Z1212 Encounter for screening for malignant neoplasm of rectum: Secondary | ICD-10-CM

## 2016-04-30 DIAGNOSIS — Q438 Other specified congenital malformations of intestine: Secondary | ICD-10-CM | POA: Insufficient documentation

## 2016-04-30 DIAGNOSIS — K648 Other hemorrhoids: Secondary | ICD-10-CM | POA: Insufficient documentation

## 2016-04-30 DIAGNOSIS — Z800 Family history of malignant neoplasm of digestive organs: Secondary | ICD-10-CM | POA: Insufficient documentation

## 2016-04-30 DIAGNOSIS — Z79899 Other long term (current) drug therapy: Secondary | ICD-10-CM | POA: Insufficient documentation

## 2016-04-30 DIAGNOSIS — Z1211 Encounter for screening for malignant neoplasm of colon: Secondary | ICD-10-CM | POA: Insufficient documentation

## 2016-04-30 DIAGNOSIS — Z8371 Family history of colonic polyps: Secondary | ICD-10-CM | POA: Insufficient documentation

## 2016-04-30 DIAGNOSIS — I10 Essential (primary) hypertension: Secondary | ICD-10-CM | POA: Insufficient documentation

## 2016-04-30 DIAGNOSIS — K621 Rectal polyp: Secondary | ICD-10-CM

## 2016-04-30 DIAGNOSIS — K625 Hemorrhage of anus and rectum: Secondary | ICD-10-CM

## 2016-04-30 HISTORY — DX: Calculus of kidney: N20.0

## 2016-04-30 HISTORY — PX: COLONOSCOPY: SHX5424

## 2016-04-30 SURGERY — COLONOSCOPY
Anesthesia: Moderate Sedation

## 2016-04-30 MED ORDER — MEPERIDINE HCL 100 MG/ML IJ SOLN
INTRAMUSCULAR | Status: DC | PRN
  Administered 2016-04-30 (×3): 25 mg

## 2016-04-30 MED ORDER — SODIUM CHLORIDE 0.9 % IV SOLN
INTRAVENOUS | Status: DC
  Administered 2016-04-30: 09:00:00 via INTRAVENOUS

## 2016-04-30 MED ORDER — MEPERIDINE HCL 100 MG/ML IJ SOLN
INTRAMUSCULAR | Status: DC
Start: 2016-04-30 — End: 2016-04-30
  Filled 2016-04-30: qty 2

## 2016-04-30 MED ORDER — MIDAZOLAM HCL 5 MG/5ML IJ SOLN
INTRAMUSCULAR | Status: DC | PRN
  Administered 2016-04-30: 1 mg via INTRAVENOUS
  Administered 2016-04-30 (×3): 2 mg via INTRAVENOUS

## 2016-04-30 MED ORDER — MIDAZOLAM HCL 5 MG/5ML IJ SOLN
INTRAMUSCULAR | Status: AC
  Filled 2016-04-30: qty 10

## 2016-04-30 MED ORDER — ONDANSETRON HCL 4 MG/2ML IJ SOLN
INTRAMUSCULAR | Status: DC | PRN
Start: 2016-04-30 — End: 2016-04-30
  Administered 2016-04-30: 4 mg via INTRAVENOUS

## 2016-04-30 MED ORDER — ONDANSETRON HCL 4 MG/2ML IJ SOLN
INTRAMUSCULAR | Status: AC
  Filled 2016-04-30: qty 2

## 2016-04-30 MED ORDER — STERILE WATER FOR IRRIGATION IR SOLN
Status: DC | PRN
  Administered 2016-04-30: 10:00:00

## 2016-04-30 NOTE — Op Note (Signed)
[facility] Patient Name: Emily Colon Procedure Date: 04/30/2016 9:17 AM MRN: [id number] Date of Birth: 07-Mar-1980 Attending MD: Jonette Eva , MD CSN: [id number] Age: 36 Admit Type: Outpatient Procedure:                Colonoscopy WITH COLD FORCEPS POLYPECTOMY Indications:              Colon cancer screening in patient at increased                            risk: Family history of 1st-degree relative with                            colon polyps before age 85 years Providers:                Jonette Eva, MD, Jannett Celestine, RN, Lollie Marrow. Lake,                            Pensions consultant Referring MD:             Catalina Pizza, MD Medicines:                Meperidine 75 mg IV, Midazolam 7 mg IV, Ondansetron                            4 mg IV Complications:            No immediate complications. Estimated Blood Loss:     Estimated blood loss was minimal. Procedure:                Pre-Anesthesia Assessment:                           - Prior to the procedure, a History and Physical                            was performed, and patient medications and                            allergies were reviewed. The patient's tolerance of                            previous anesthesia was also reviewed. The risks                            and benefits of the procedure and the sedation                            options and risks were discussed with the patient.                            All questions were answered, and informed consent                            was obtained. Prior Anticoagulants: The patient has  taken no previous anticoagulant or antiplatelet                            agents. ASA Grade Assessment: II - A patient with                            mild systemic disease. After reviewing the risks                            and benefits, the patient was deemed in                            satisfactory condition to undergo the procedure.                             After obtaining informed consent, the colonoscope                            was passed under direct vision. Throughout the                            procedure, the patient's blood pressure, pulse, and                            oxygen saturations were monitored continuously. The                            EC-[id number] ([id number]) scope was introduced through                            the anus and advanced to the the cecum, identified                            by appendiceal orifice and ileocecal valve. The                            colonoscopy was technically difficult and complex                            due to significant looping. Successful completion                            of the procedure was aided by increasing the dose                            of sedation medication, applying abdominal pressure                            and COLOWRAP. The patient tolerated the procedure                            fairly well. The quality of the bowel preparation  was excellent. The ileocecal valve, appendiceal                            orifice, and rectum were photographed. Scope In: 9:54:21 AM Scope Out: 10:17:57 AM Scope Withdrawal Time: 0 hours 12 minutes 31 seconds  Total Procedure Duration: 0 hours 23 minutes 36 seconds  Findings:      The recto-sigmoid colon, sigmoid colon and descending colon were       moderately redundant.      A 3 mm polyp was found in the rectum. The polyp was sessile. The polyp       was removed with a cold biopsy forceps. Resection and retrieval were       complete.      Non-bleeding internal hemorrhoids were found during retroflexion. The       hemorrhoids were moderate. Impression:               - Redundant LEFT colon.                           - One 3 mm polyp in the rectum, removed with a cold                            biopsy forceps. Resected and retrieved.                           - Non-bleeding internal  hemorrhoids. Moderate Sedation:      Moderate (conscious) sedation was administered by the endoscopy nurse       and supervised by the endoscopist. The following parameters were       monitored: oxygen saturation, heart rate, blood pressure, and response       to care. Total physician intraservice time was 37 minutes. Recommendation:           - High fiber diet. LOSE WEIGHT.                           - Continue present medications.                           - Await pathology results.                           - Repeat colonoscopy in 5-10 years for surveillance                            WITH COLOWRAP.                           - Patient has a contact number available for                            emergencies. The signs and symptoms of potential                            delayed complications were discussed with the  patient. Return to normal activities tomorrow.                            Written discharge instructions were provided to the                            patient. Procedure Code(s):        --- Professional ---                           [phone], Colonoscopy, flexible; with biopsy, single                            or multiple                           99152, Moderate sedation services provided by the                            same physician or other qualified health care                            professional performing the diagnostic or                            therapeutic service that the sedation supports,                            requiring the presence of an independent trained                            observer to assist in the monitoring of the                            patient's level of consciousness and physiological                            status; initial 15 minutes of intraservice time,                            patient age 14 years or older                           [phone], Moderate sedation services; each additional                             15 minutes intraservice time Diagnosis Code(s):        --- Professional ---                           Z83.71, Family history of colonic polyps                           K62.1, Rectal polyp  K64.8, Other hemorrhoids                           Q43.8, Other specified congenital malformations of                            intestine CPT copyright 2016 [employer]. All rights reserved. The codes documented in this report are preliminary and upon coder review may  be revised to meet current compliance requirements. Jonette Eva, MD Jonette Eva, MD 04/30/2016 10:42:12 AM This report has been signed electronically. Number of Addenda: 0

## 2016-04-30 NOTE — H&P (Addendum)
Primary Care Physician:  Dwana Melena, MD Primary Gastroenterologist:  Dr. Darrick Penna  Pre-Procedure History & Physical: HPI:  Emily Colon is a 36 y.o. female here for COLON CANCER SCREENING- FAMILY Hx COLON CA-FATHER & sister with polyps AGE < 60.  Past Medical History:  Diagnosis Date  . Hypertension   . Kidney stone   . PONV (postoperative nausea and vomiting)     Past Surgical History:  Procedure Laterality Date  . CARPAL TUNNEL RELEASE  2005 and 2006   bilateral    also cubital tunnel  . CHOLECYSTECTOMY  11/07/2010   Procedure: LAPAROSCOPIC CHOLECYSTECTOMY;  Surgeon: Fabio Bering;  Location: [facility];  Service: General;  Laterality: N/A;  . URETHRA SURGERY  age 66  . WISDOM TOOTH EXTRACTION  2001    Prior to Admission medications   Medication Sig Start Date End Date Taking? Authorizing Provider  acetaminophen (TYLENOL) 500 MG tablet Take 1,000 mg by mouth 2 (two) times daily as needed for moderate pain or headache.   Yes Historical Provider, MD  CAMILA 0.35 MG tablet Take 1 tablet by mouth daily.  03/02/16  Yes Historical Provider, MD  cyclobenzaprine (FLEXERIL) 10 MG tablet Take 10 mg by mouth 3 (three) times daily as needed for muscle spasms.   Yes Historical Provider, MD  losartan (COZAAR) 100 MG tablet Take 100 mg by mouth daily.   Yes Historical Provider, MD  Multiple Vitamins-Calcium (ONE-A-DAY WOMENS PO) Take 1 tablet by mouth daily.   Yes Historical Provider, MD  nebivolol (BYSTOLIC) 5 MG tablet Take 5 mg by mouth daily.   Yes Historical Provider, MD  polyethylene glycol-electrolytes (TRILYTE) 420 g solution Take 4,000 mLs by mouth as directed. 04/15/16  Yes West Bali, MD  Probiotic CAPS Take 1 capsule by mouth daily.   Yes Historical Provider, MD    Allergies as of 04/15/2016  . (No Known Allergies)    Family History  Problem Relation Age of Onset  . Prostate cancer Father   . Anesthesia problems Neg Hx   . Hypotension Neg Hx   . Malignant hyperthermia Neg  Hx   . Pseudochol deficiency Neg Hx   . Colon cancer Neg Hx     Social History   Social History  . Marital status: Married    Spouse name: N/A  . Number of children: N/A  . Years of education: N/A   Occupational History  . Not on file.   Social History Main Topics  . Smoking status: Never Smoker  . Smokeless tobacco: Never Used  . Alcohol use No  . Drug use: No  . Sexual activity: Yes    Birth control/ protection: Pill   Other Topics Concern  . Not on file   Social History Narrative  . No narrative on file    Review of Systems: See HPI, otherwise negative ROS   Physical Exam: BP (!) 161/80   Pulse 74   Temp 98.1 F (36.7 C) (Oral)   Resp 18   Ht 5\' 7"  (1.702 m)   Wt 275 lb (124.7 kg)   LMP 04/27/2016 (Exact Date)   SpO2 97%   BMI 43.07 kg/m  General:   Alert,  pleasant and cooperative in NAD Head:  Normocephalic and atraumatic. Neck:  Supple; Lungs:  Clear throughout to auscultation.    Heart:  Regular rate and rhythm. Abdomen:  Soft, nontender and nondistended. Normal bowel sounds, without guarding, and without rebound.   Neurologic:  Alert and  oriented x4;  grossly normal neurologically.  Impression/Plan:     SCREENING  Plan:  1. TCS TODAY. DISCUSSED PROCEDURE, BENEFITS, & RISKS: < 1% chance of medication reaction, bleeding, perforation, or rupture of spleen/liver.

## 2016-04-30 NOTE — Discharge Instructions (Signed)
You had 1 SMALL polyp removed. You have internal hemorrhoids.   CONTINUE YOUR WEIGHT LOSS EFFORTS.  WHILE I DO NOT WANT TO ALARM YOU, YOUR BODY MASS INDEX IS OVER 40, WHICH MEANS YOU ARE MORBIDLY OBESE. Your greatest risk factor for cancer are genetics and your weight. OBESITY drives cancer genes and IS ASSOCIATED WITH AN INCREASE RISK FOR ALL CANCERS, INCLUDING ESOPHAGEAL AND COLON CANCER.  DRINK WATER TO KEEP YOUR URINE LIGHT YELLOW.   FOLLOW A HIGH FIBER DIET. AVOID ITEMS THAT CAUSE BLOATING & GAS. SEE INFO BELOW.  YOUR BIOPSY RESULTS WILL BE AVAILABLE IN MY CHART AFTER MAR 12 AND MY OFFICE WILL CONTACT YOU IN 10-14 DAYS WITH YOUR RESULTS.   Next colonoscopy in 5-10 years.    Colonoscopy Care After Read the instructions outlined below and refer to this sheet in the next week. These discharge instructions provide you with general information on caring for yourself after you leave the hospital. While your treatment has been planned according to the most current medical practices available, unavoidable complications occasionally occur. If you have any problems or questions after discharge, call DR. Cyerra Yim, [phone].  ACTIVITY  You may resume your regular activity, but move at a slower pace for the next 24 hours.   Take frequent rest periods for the next 24 hours.   Walking will help get rid of the air and reduce the bloated feeling in your belly (abdomen).   No driving for 24 hours (because of the medicine (anesthesia) used during the test).   You may shower.   Do not sign any important legal documents or operate any machinery for 24 hours (because of the anesthesia used during the test).    NUTRITION  Drink plenty of fluids.   You may resume your normal diet as instructed by your doctor.   Begin with a light meal and progress to your normal diet. Heavy or fried foods are harder to digest and may make you feel sick to your stomach (nauseated).   Avoid alcoholic beverages  for 24 hours or as instructed.    MEDICATIONS  You may resume your normal medications.   WHAT YOU CAN EXPECT TODAY  Some feelings of bloating in the abdomen.   Passage of more gas than usual.   Spotting of blood in your stool or on the toilet paper  .  IF YOU HAD POLYPS REMOVED DURING THE COLONOSCOPY:  Eat a soft diet IF YOU HAVE NAUSEA, BLOATING, ABDOMINAL PAIN, OR VOMITING.    FINDING OUT THE RESULTS OF YOUR TEST Not all test results are available during your visit. DR. Darrick Penna WILL CALL YOU WITHIN 14 DAYS OF YOUR PROCEDUE WITH YOUR RESULTS. Do not assume everything is normal if you have not heard from DR. Jakevious Hollister, CALL HER OFFICE AT [phone].  SEEK IMMEDIATE MEDICAL ATTENTION AND CALL THE OFFICE: [phone] IF:  You have more than a spotting of blood in your stool.   Your belly is swollen (abdominal distention).   You are nauseated or vomiting.   You have a temperature over 101F.   You have abdominal pain or discomfort that is severe or gets worse throughout the day.   High-Fiber Diet A high-fiber diet changes your normal diet to include more whole grains, legumes, fruits, and vegetables. Changes in the diet involve replacing refined carbohydrates with unrefined foods. The calorie level of the diet is essentially unchanged. The Dietary Reference Intake (recommended amount) for adult males is 38 grams per day. For adult females, it  is 25 grams per day. Pregnant and lactating women should consume 28 grams of fiber per day. Fiber is the intact part of a plant that is not broken down during digestion. Functional fiber is fiber that has been isolated from the plant to provide a beneficial effect in the body. PURPOSE  Increase stool bulk.   Ease and regulate bowel movements.   Lower cholesterol.   REDUCE RISK OF COLON CANCER  INDICATIONS THAT YOU NEED MORE FIBER  Constipation and hemorrhoids.   Uncomplicated diverticulosis (intestine condition) and irritable  bowel syndrome.   Weight management.   As a protective measure against hardening of the arteries (atherosclerosis), diabetes, and cancer.   GUIDELINES FOR INCREASING FIBER IN THE DIET  Start adding fiber to the diet slowly. A gradual increase of about 5 more grams (2 slices of whole-wheat bread, 2 servings of most fruits or vegetables, or 1 bowl of high-fiber cereal) per day is best. Too rapid an increase in fiber may result in constipation, flatulence, and bloating.   Drink enough water and fluids to keep your urine clear or pale yellow. Water, juice, or caffeine-free drinks are recommended. Not drinking enough fluid may cause constipation.   Eat a variety of high-fiber foods rather than one type of fiber.   Try to increase your intake of fiber through using high-fiber foods rather than fiber pills or supplements that contain small amounts of fiber.   The goal is to change the types of food eaten. Do not supplement your present diet with high-fiber foods, but replace foods in your present diet.   INCLUDE A VARIETY OF FIBER SOURCES  Replace refined and processed grains with whole grains, canned fruits with fresh fruits, and incorporate other fiber sources. White rice, white breads, and most bakery goods contain little or no fiber.   Brown whole-grain rice, buckwheat oats, and many fruits and vegetables are all good sources of fiber. These include: broccoli, Brussels sprouts, cabbage, cauliflower, beets, sweet potatoes, white potatoes (skin on), carrots, tomatoes, eggplant, squash, berries, fresh fruits, and dried fruits.   Cereals appear to be the richest source of fiber. Cereal fiber is found in whole grains and bran. Bran is the fiber-rich outer coat of cereal grain, which is largely removed in refining. In whole-grain cereals, the bran remains. In breakfast cereals, the largest amount of fiber is found in those with "bran" in their names. The fiber content is sometimes indicated on the  label.   You may need to include additional fruits and vegetables each day.   In baking, for 1 cup white flour, you may use the following substitutions:   1 cup whole-wheat flour minus 2 tablespoons.   1/2 cup white flour plus 1/2 cup whole-wheat flour.   Polyps, Colon  A polyp is extra tissue that grows inside your body. Colon polyps grow in the large intestine. The large intestine, also called the colon, is part of your digestive system. It is a long, hollow tube at the end of your digestive tract where your body makes and stores stool. Most polyps are not dangerous. They are benign. This means they are not cancerous. But over time, some types of polyps can turn into cancer. Polyps that are smaller than a pea are usually not harmful. But larger polyps could someday become or may already be cancerous. To be safe, doctors remove all polyps and test them.   WHO GETS POLYPS? Anyone can get polyps, but certain people are more likely than others. You may  have a greater chance of getting polyps if:  You are over 50.   You have had polyps before.   Someone in your family has had polyps.   Someone in your family has had cancer of the large intestine.   Find out if someone in your family has had polyps. You may also be more likely to get polyps if you:   Eat a lot of fatty foods   Smoke   Drink alcohol   Do not exercise  Eat too much   PREVENTION There is not one sure way to prevent polyps. You might be able to lower your risk of getting them if you:  Eat more fruits and vegetables and less fatty food.   Do not smoke.   Avoid alcohol.   Exercise every day.   Lose weight if you are overweight.   Eating more calcium and folate can also lower your risk of getting polyps. Some foods that are rich in calcium are milk, cheese, and broccoli. Some foods that are rich in folate are chickpeas, kidney beans, and spinach.   Hemorrhoids Hemorrhoids are dilated (enlarged) veins around the  rectum. Sometimes clots will form in the veins. This makes them swollen and painful. These are called thrombosed hemorrhoids. Causes of hemorrhoids include:  Constipation.   Straining to have a bowel movement.   HEAVY LIFTING  HOME CARE INSTRUCTIONS  Eat a well balanced diet and drink 6 to 8 glasses of water every day to avoid constipation. You may also use a bulk laxative.   Avoid straining to have bowel movements.   Keep anal area dry and clean.   Do not use a donut shaped pillow or sit on the toilet for long periods. This increases blood pooling and pain.   Move your bowels when your body has the urge; this will require less straining and will decrease pain and pressure.

## 2016-05-01 ENCOUNTER — Encounter (HOSPITAL_COMMUNITY): Payer: Self-pay | Admitting: Gastroenterology

## 2016-05-12 NOTE — Progress Notes (Signed)
LMOM to call.

## 2016-05-12 NOTE — Progress Notes (Signed)
PT is aware.

## 2016-06-15 ENCOUNTER — Ambulatory Visit: Payer: BC Managed Care – PPO | Admitting: Nurse Practitioner

## 2016-07-02 ENCOUNTER — Encounter: Payer: Self-pay | Admitting: Nurse Practitioner

## 2016-07-02 ENCOUNTER — Ambulatory Visit (INDEPENDENT_AMBULATORY_CARE_PROVIDER_SITE_OTHER): Payer: BC Managed Care – PPO | Admitting: Nurse Practitioner

## 2016-07-02 VITALS — BP 183/109 | HR 71 | Temp 97.8°F | Ht 67.0 in | Wt 279.8 lb

## 2016-07-02 DIAGNOSIS — R7989 Other specified abnormal findings of blood chemistry: Secondary | ICD-10-CM | POA: Diagnosis not present

## 2016-07-02 DIAGNOSIS — R945 Abnormal results of liver function studies: Secondary | ICD-10-CM

## 2016-07-02 DIAGNOSIS — K625 Hemorrhage of anus and rectum: Secondary | ICD-10-CM | POA: Diagnosis not present

## 2016-07-02 DIAGNOSIS — R030 Elevated blood-pressure reading, without diagnosis of hypertension: Secondary | ICD-10-CM | POA: Diagnosis not present

## 2016-07-02 NOTE — Progress Notes (Signed)
CC'ED TO PCP 

## 2016-07-02 NOTE — Assessment & Plan Note (Signed)
No further rectal bleeding. Generally asymptomatic from a GI standpoint other than intermittent, mild celiac disease symptoms. Colonoscopy up-to-date due in 2028. Return for follow-up in 6 months for elevated LFTs as per below.

## 2016-07-02 NOTE — Assessment & Plan Note (Signed)
The patient has a history of hypertension and was previously on losartan and Bystolic. Due to increased blood pressure her Bystolic dose was recently increased but she has not started the new dose that she is waiting for a home BP monitor that she can use to monitor her blood pressures at home. Her blood pressure is significantly elevated today at 183/109. However, denies current chest pain, dyspnea, dizziness, lightheadedness. States she "feels fine." I recommended she start her new dose of distal as soon as possible. Follow-up with primary care. ER precautions given related to cardiac disease and hypertensive urgency.

## 2016-07-02 NOTE — Progress Notes (Signed)
Referring Provider: Benita Stabile, MD Primary Care Physician:  Benita Stabile, MD Primary GI:  Dr. Darrick Penna  Chief Complaint  Patient presents with  . Rectal Bleeding    f/u, doing ok, had tcs 04/30/16    HPI:   Emily Colon is a 36 y.o. female who presents for follow-up on rectal bleeding. The patient was last seen in our office 04/15/2016 for elevated LFTs, family history of colon polyps, rectal bleeding. At that time her PCP included labs that showed AST/ALT with minimal elevation of 37/65 and a borderline hemoglobin A1c of 5.9. Her sister had recently had a very large polyp in her sisters gastroenterologist recommended colonoscopy for all first-degree relatives. Her father also had multiple polyps historically.  History of gluten allergy and she was having some symptoms due to difficulty eating gluten for a period more diarrhea after cholecystectomy. GERD symptoms treated with Nexium with improvement. CMP and viral hepatitis panels were checked, consider autoimmune workup due to family history of "immune issues." Also right upper quadrant ultrasound. She was referred for colonoscopy as well.  AST/ALT again mildly elevated at 39/64, other liver labs normal. Hepatitis serologies showed negative for hepatitis A and B, appears to be hepatitis B immune from vaccination. Right upper quadrant ultrasound shows consistent with fatty liver.  Colonoscopy completed 04/30/2016 which found a single 3 mm polyp in the rectum status post removal, nonbleeding internal hemorrhoids, redundant left colon. Recommended weight loss, continue current medications. Polyp found to be hyperplastic. Recommended 10 year repeat colonoscopy.  Today she states she's doing well overall. Recently had a kidney stone and has been treated and she is significantly better now. Denies any worsening than baseline abdominal pain, N/V, hematochezia, melena, fever, chills, unintentional weight loss. Denies yellowing of skin/eyes,  darkened urine, acute episodic confusion, tremors. Has had intermittent chest pain over the past couple years which has been evaluated and dx costochondritis versus reflux. Denies chest pain, dyspnea, lightheadedness, dizziness, syncope, near syncope. Denies any other upper or lower GI symptoms.  Her hypertension medication was recently adjusted but hasn't started new dose yet.  Past Medical History:  Diagnosis Date  . Hypertension   . Kidney stone   . PONV (postoperative nausea and vomiting)     Past Surgical History:  Procedure Laterality Date  . CARPAL TUNNEL RELEASE  2005 and 2006   bilateral    also cubital tunnel  . CHOLECYSTECTOMY  11/07/2010   Procedure: LAPAROSCOPIC CHOLECYSTECTOMY;  Surgeon: Fabio Bering;  Location: [facility];  Service: General;  Laterality: N/A;  . COLONOSCOPY N/A 04/30/2016   Procedure: COLONOSCOPY;  Surgeon: West Bali, MD;  Location: [facility];  Service: Endoscopy;  Laterality: N/A;  9:45am  . URETHRA SURGERY  age 61  . WISDOM TOOTH EXTRACTION  2001    Current Outpatient Prescriptions  Medication Sig Dispense Refill  . acetaminophen (TYLENOL) 500 MG tablet Take 1,000 mg by mouth 2 (two) times daily as needed for moderate pain or headache.    Marland Kitchen CAMILA 0.35 MG tablet Take 1 tablet by mouth daily.     . cyclobenzaprine (FLEXERIL) 10 MG tablet Take 10 mg by mouth 3 (three) times daily as needed for muscle spasms.    Marland Kitchen losartan (COZAAR) 100 MG tablet Take 100 mg by mouth daily.    . Multiple Vitamins-Calcium (ONE-A-DAY WOMENS PO) Take 1 tablet by mouth daily.    . nebivolol (BYSTOLIC) 5 MG tablet Take 5 mg by mouth daily.  No current facility-administered medications for this visit.     Allergies as of 07/02/2016 - Review Complete 07/02/2016  Allergen Reaction Noted  . Gluten meal Swelling 04/28/2016  . Other Nausea And Vomiting 04/28/2016    Family History  Problem Relation Age of Onset  . Prostate cancer Father   . Anesthesia problems Neg  Hx   . Hypotension Neg Hx   . Malignant hyperthermia Neg Hx   . Pseudochol deficiency Neg Hx   . Colon cancer Neg Hx     Social History   Social History  . Marital status: Married    Spouse name: N/A  . Number of children: N/A  . Years of education: N/A   Social History Main Topics  . Smoking status: Never Smoker  . Smokeless tobacco: Never Used  . Alcohol use No  . Drug use: No  . Sexual activity: Yes    Birth control/ protection: Pill   Other Topics Concern  . None   Social History Narrative  . None    Review of Systems: Complete ROS negative except as per HPI.   Physical Exam: BP (!) 183/109   Pulse 71   Temp 97.8 F (36.6 C) (Oral)   Ht 5\' 7"  (1.702 m)   Wt 279 lb 12.8 oz (126.9 kg)   LMP 06/18/2016 (Approximate) Comment: irregular periods  BMI 43.82 kg/m  General:   Obese female. Alert and oriented. Pleasant and cooperative. Well-nourished and well-developed.  Head:  Normocephalic and atraumatic. Eyes:  Without icterus, sclera clear and conjunctiva pink.  Ears:  Normal auditory acuity. Cardiovascular:  S1, S2 present without murmurs appreciated. Extremities without clubbing or edema. Respiratory:  Clear to auscultation bilaterally. No wheezes, rales, or rhonchi. No distress.  Gastrointestinal:  +BS, obese but soft, non-tender and non-distended. No HSM noted. No guarding or rebound. No masses appreciated.  Rectal:  Deferred  Musculoskalatal:  Symmetrical without gross deformities. Neurologic:  Alert and oriented x4;  grossly normal neurologically. Psych:  Alert and cooperative. Normal mood and affect. Heme/Lymph/Immune: No excessive bruising noted.    07/02/2016 10:43 AM   Disclaimer: This note was dictated with voice recognition software. Similar sounding words can inadvertently be transcribed and may not be corrected upon review.

## 2016-07-02 NOTE — Assessment & Plan Note (Signed)
The patient has mild elevation of LFTs less than 2 times upper limit normal. Viral serologies negative. Based on clinical guidelines will provide education related to fatty liver disease consistent with ultrasound findings and return for follow-up in 6 months for monitoring and consideration of autoimmune serologies.

## 2016-07-02 NOTE — Patient Instructions (Addendum)
1. Start your new dose of blood pressure medication as soon as he can. 2. Below his information on fatty liver disease. 3. We will actually have you come back in 6 months to recheck your liver enzymes and consider further testing.     Fatty Liver Fatty liver, also called hepatic steatosis or steatohepatitis, is a condition in which too much fat has built up in your liver cells. The liver removes harmful substances from your bloodstream. It produces fluids your body needs. It also helps your body use and store energy from the food you eat. In many cases, fatty liver does not cause symptoms or problems. It is often diagnosed when tests are being done for other reasons. However, over time, fatty liver can cause inflammation that may lead to more serious liver problems, such as scarring of the liver (cirrhosis). What are the causes? Causes of fatty liver may include:  Drinking too much alcohol.  Poor nutrition.  Obesity.  Cushing syndrome.  Diabetes.  Hyperlipidemia.  Pregnancy.  Certain drugs.  Poisons.  Some viral infections. What increases the risk? You may be more likely to develop fatty liver if you:  Abuse alcohol.  Are pregnant.  Are overweight.  Have diabetes.  Have hepatitis.  Have a high triglyceride level. What are the signs or symptoms? Fatty liver often does not cause any symptoms. In cases where symptoms develop, they can include:  Fatigue.  Weakness.  Weight loss.  Confusion.  Abdominal pain.  Yellowing of your skin and the white parts of your eyes (jaundice).  Nausea and vomiting. How is this diagnosed? Fatty liver may be diagnosed by:  Physical exam and medical history.  Blood tests.  Imaging tests, such as an ultrasound, CT scan, or MRI.  Liver biopsy. A small sample of liver tissue is removed using a needle. The sample is then looked at under a microscope. How is this treated? Fatty liver is often caused by other health  conditions. Treatment for fatty liver may involve medicines and lifestyle changes to manage conditions such as:  Alcoholism.  High cholesterol.  Diabetes.  Being overweight or obese. Follow these instructions at home:  Eat a healthy diet as directed by your health care provider.  Exercise regularly. This can help you lose weight and control your cholesterol and diabetes. Talk to your health care provider about an exercise plan and which activities are best for you.  Do not drink alcohol.  Take medicines only as directed by your health care provider. Contact a health care provider if: You have difficulty controlling your:  Blood sugar.  Cholesterol.  Alcohol consumption. Get help right away if:  You have abdominal pain.  You have jaundice.  You have nausea and vomiting. This information is not intended to replace advice given to you by your health care provider. Make sure you discuss any questions you have with your health care provider. Document Released: 03/27/2005 Document Revised: 07/18/2015 Document Reviewed: 06/21/2013 Elsevier Interactive Patient Education  2017 Elsevier Inc.    Nonalcoholic Fatty Liver Disease Diet Nonalcoholic fatty liver disease is a condition that causes fat to accumulate in and around the liver. The disease makes it harder for the liver to work the way that it should. Following a healthy diet can help to keep nonalcoholic fatty liver disease under control. It can also help to prevent or improve conditions that are associated with the disease, such as heart disease, diabetes, high blood pressure, and abnormal cholesterol levels. Along with regular exercise, this  diet:  Promotes weight loss.  Helps to control blood sugar levels.  Helps to improve the way that the body uses insulin. What do I need to know about this diet?  Use the glycemic index (GI) to plan your meals. The index tells you how quickly a food will raise your blood sugar.  Choose low-GI foods. These foods take a longer time to raise blood sugar.  Keep track of how many calories you take in. Eating the right amount of calories will help you to achieve a healthy weight.  You may want to follow a Mediterranean diet. This diet includes a lot of vegetables, lean meats or fish, whole grains, fruits, and healthy oils and fats. What foods can I eat? Grains  Whole grains, such as whole-wheat or whole-grain breads, crackers, tortillas, cereals, and pasta. Stone-ground whole wheat. Pumpernickel bread. Unsweetened oatmeal. Bulgur. Barley. Quinoa. Brown or wild rice. Corn or whole-wheat flour tortillas. Vegetables  Lettuce. Spinach. Peas. Beets. Cauliflower. Cabbage. Broccoli. Carrots. Tomatoes. Squash. Eggplant. Herbs. Peppers. Onions. Cucumbers. Brussels sprouts. Yams and sweet potatoes. Beans. Lentils. Fruits  Bananas. Apples. Oranges. Grapes. Papaya. Mango. Pomegranate. Kiwi. Grapefruit. Cherries. Meats and Other Protein Sources  Seafood and shellfish. Lean meats. Poultry. Tofu. Dairy  Low-fat or fat-free dairy products, such as yogurt, cottage cheese, and cheese. Beverages  Water. Sugar-free drinks. Tea. Coffee. Low-fat or skim milk. Milk alternatives, such as soy or almond milk. Real fruit juice. Condiments  Mustard. Relish. Low-fat, low-sugar ketchup and barbecue sauce. Low-fat or fat-free mayonnaise. Sweets and Desserts  Sugar-free sweets. Fats and Oils  Avocado. Canola or olive oil. Nuts and nut butters. Seeds. The items listed above may not be a complete list of recommended foods or beverages. Contact your dietitian for more options.  What foods are not recommended? Palm oil and coconut oil. Processed foods. Fried foods. Sweetened drinks, such as sweet tea, milkshakes, snow cones, iced sweet drinks, and sodas. Alcohol. Sweets. Foods that contain a lot of salt or sodium. The items listed above may not be a complete list of foods and beverages to avoid. Contact  your dietitian for more information.  This information is not intended to replace advice given to you by your health care provider. Make sure you discuss any questions you have with your health care provider. Document Released: 06/26/2014 Document Revised: 07/18/2015 Document Reviewed: 03/06/2014 Elsevier Interactive Patient Education  2017 [employer]

## 2016-10-09 NOTE — Progress Notes (Signed)
REVIEWED-NO ADDITIONAL RECOMMENDATIONS. 

## 2017-01-04 ENCOUNTER — Ambulatory Visit: Payer: BC Managed Care – PPO | Admitting: Nurse Practitioner

## 2017-01-22 ENCOUNTER — Other Ambulatory Visit: Payer: Self-pay | Admitting: Obstetrics and Gynecology

## 2017-01-22 DIAGNOSIS — Z803 Family history of malignant neoplasm of breast: Secondary | ICD-10-CM

## 2017-05-21 ENCOUNTER — Ambulatory Visit: Payer: BC Managed Care – PPO | Admitting: Cardiovascular Disease

## 2017-05-21 ENCOUNTER — Encounter: Payer: Self-pay | Admitting: Cardiovascular Disease

## 2017-05-21 ENCOUNTER — Other Ambulatory Visit (HOSPITAL_COMMUNITY)
Admission: RE | Admit: 2017-05-21 | Discharge: 2017-05-21 | Disposition: A | Discharge status: Home / Self Care | Payer: BC Managed Care – PPO | Source: Ambulatory Visit | Attending: Cardiology | Admitting: Cardiology

## 2017-05-21 VITALS — BP 150/100 | HR 74 | Ht 67.0 in | Wt 267.0 lb

## 2017-05-21 DIAGNOSIS — R079 Chest pain, unspecified: Secondary | ICD-10-CM | POA: Diagnosis not present

## 2017-05-21 DIAGNOSIS — R252 Cramp and spasm: Secondary | ICD-10-CM

## 2017-05-21 DIAGNOSIS — I10 Essential (primary) hypertension: Secondary | ICD-10-CM

## 2017-05-21 LAB — TSH: TSH: 2.23 u[IU]/mL (ref 0.350–4.500)

## 2017-05-21 LAB — BASIC METABOLIC PANEL
Anion gap: 13 (ref 5–15)
BUN: 15 mg/dL (ref 6–20)
CO2: 21 mmol/L — ABNORMAL LOW (ref 22–32)
CREATININE: 0.76 mg/dL (ref 0.44–1.00)
Calcium: 9.5 mg/dL (ref 8.9–10.3)
Chloride: 100 mmol/L — ABNORMAL LOW (ref 101–111)
GFR calc Af Amer: >60 mL/min (ref >60)
GLUCOSE: 173 mg/dL — AB (ref 65–99)
POTASSIUM: 3.9 mmol/L (ref 3.5–5.1)
SODIUM: 134 mmol/L — AB (ref 135–145)

## 2017-05-21 MED ORDER — AMLODIPINE BESYLATE 5 MG PO TABS: 5.0000 mg | ORAL_TABLET | Freq: Every day | ORAL | 3 refills | Status: DC

## 2017-05-21 NOTE — Progress Notes (Signed)
CARDIOLOGY CONSULT NOTE  Patient ID: Emily PaliJennifer S Hadley MRN: [id number] DOB/AGE: [birth year] 35 y.o.  Admit date: (Not on file) Primary Physician: Benita StabileHall, John Z, MD Referring Physician: Dr. Margo AyeHall  Reason for Consultation: Chest pain, accelerated hypertension  HPI: Emily Colon is a 37 y.o. female who is being seen today for the evaluation of chest pain and accelerated hypertension at the request of Benita StabileHall, John Z, MD.   She told me she developed hypertension within the past few years.  [place] moved [place] from [place], [place] about 11 or 12 years ago.  When she was in [place] she saw an endocrinologist who told her she had a thyroid disorder and she had been on medication for this.  [place] moved to [place], she saw another endocrinologist who told her she did not have a thyroid disorder and medications were discontinued.  She had been on Bystolic about 6 months ago and then it was stopped.  He was started again this past week and she takes 10 mg.  She has been on Benicar hydrochlorothiazide for the past 6 months.  She has been experiencing chest tightness with radiation down the left arm and hand with associated left arm numbness.  The pain occasionally radiates into the left side of her neck.  This occurs without exertion.  She also complains of bilateral leg and feet cramping.  She denies leg swelling, orthopnea, and paroxysmal nocturnal dyspnea.  She has been checking her blood pressures.  I reviewed her blood pressure log.  It was 186/114 yesterday, 186/104 this past Saturday, and 177/102 on Sunday.  She was evaluated at her PCPs office and was told she had an irregular heartbeat.  An ECG was performed.  I do not have a copy of this.  Her father developed hypertension in his 30s as well.   Allergies  Allergen Reactions  . Gluten Meal Swelling    Joint pain, lethargy   . Other Nausea And Vomiting    Quinoa    Current Outpatient Medications  Medication  Sig Dispense Refill  . acetaminophen (TYLENOL) 500 MG tablet Take 1,000 mg by mouth 2 (two) times daily as needed for moderate pain or headache.    . B Complex-C (B-COMPLEX WITH VITAMIN C) tablet Take 1 tablet by mouth daily.    . Cholecalciferol (VITAMIN D3 ADULT GUMMIES) 1000 units CHEW Chew by mouth.    . Magnesium Carbonate 250 MG/GM POWD Take by mouth.    . Multiple Vitamins-Calcium (ONE-A-DAY WOMENS PO) Take 1 tablet by mouth daily.    . nebivolol (BYSTOLIC) 5 MG tablet Take 5 mg by mouth daily.    . norethindrone (MICRONOR,CAMILA,ERRIN) 0.35 MG tablet Take 1 tablet by mouth daily.    Marland Kitchen. olmesartan-hydrochlorothiazide (BENICAR HCT) 40-12.5 MG tablet Take 1 tablet by mouth daily.     . cyclobenzaprine (FLEXERIL) 10 MG tablet Take 10 mg by mouth 3 (three) times daily as needed for muscle spasms.     No current facility-administered medications for this visit.     Past Medical History:  Diagnosis Date  . Hypertension   . Kidney stone   . PONV (postoperative nausea and vomiting)     Past Surgical History:  Procedure Laterality Date  . CARPAL TUNNEL RELEASE  2005 and 2006   bilateral    also cubital tunnel  . CHOLECYSTECTOMY  11/07/2010   Procedure: LAPAROSCOPIC CHOLECYSTECTOMY;  Surgeon: Fabio BeringBrent C Ziegler;  Location: [facility];  Service: General;  Laterality:  N/A;  . COLONOSCOPY N/A 04/30/2016   Procedure: COLONOSCOPY;  Surgeon: West Bali, MD;  Location: [facility];  Service: Endoscopy;  Laterality: N/A;  9:45am  . URETHRA SURGERY  age 32  . WISDOM TOOTH EXTRACTION  2001    Social History   Socioeconomic History  . Marital status: Married    Spouse name: Not on file  . Number of children: Not on file  . Years of education: Not on file  . Highest education level: Not on file  Occupational History  . Not on file  Social Needs  . Financial resource strain: Not on file  . Food insecurity:    Worry: Not on file    Inability: Not on file  . Transportation needs:    Medical:  Not on file    Non-medical: Not on file  Tobacco Use  . Smoking status: Never Smoker  . Smokeless tobacco: Never Used  Substance and Sexual Activity  . Alcohol use: No  . Drug use: No  . Sexual activity: Yes    Birth control/protection: Pill  Lifestyle  . Physical activity:    Days per week: Not on file    Minutes per session: Not on file  . Stress: Not on file  Relationships  . Social connections:    Talks on phone: Not on file    Gets together: Not on file    Attends religious service: Not on file    Active member of club or organization: Not on file    Attends meetings of clubs or organizations: Not on file    Relationship status: Not on file  . Intimate partner violence:    Fear of current or ex partner: Not on file    Emotionally abused: Not on file    Physically abused: Not on file    Forced sexual activity: Not on file  Other Topics Concern  . Not on file  Social History Narrative  . Not on file     No family history of premature CAD in 1st degree relatives.  Current Meds  Medication Sig  . acetaminophen (TYLENOL) 500 MG tablet Take 1,000 mg by mouth 2 (two) times daily as needed for moderate pain or headache.  . B Complex-C (B-COMPLEX WITH VITAMIN C) tablet Take 1 tablet by mouth daily.  . Cholecalciferol (VITAMIN D3 ADULT GUMMIES) 1000 units CHEW Chew by mouth.  . Magnesium Carbonate 250 MG/GM POWD Take by mouth.  . Multiple Vitamins-Calcium (ONE-A-DAY WOMENS PO) Take 1 tablet by mouth daily.  . nebivolol (BYSTOLIC) 5 MG tablet Take 5 mg by mouth daily.  . norethindrone (MICRONOR,CAMILA,ERRIN) 0.35 MG tablet Take 1 tablet by mouth daily.  Marland Kitchen olmesartan-hydrochlorothiazide (BENICAR HCT) 40-12.5 MG tablet Take 1 tablet by mouth daily.   . [DISCONTINUED] CAMILA 0.35 MG tablet Take 1 tablet by mouth daily.   . [DISCONTINUED] losartan (COZAAR) 100 MG tablet Take 100 mg by mouth daily.      Review of systems complete and found to be negative unless listed above  in HPI    Physical exam Blood pressure (!) 150/100, pulse 74, height 5\' 7"  (1.702 m), weight 267 lb (121.1 kg), SpO2 95 %. General: NAD Neck: No JVD, no thyromegaly or thyroid nodule.  Lungs: Clear to auscultation bilaterally with normal respiratory effort. CV: Nondisplaced PMI. Regular rate and rhythm, normal S1/S2, no S3/S4, no murmur.  No peripheral edema.  No carotid bruit.   Abdomen: Soft, nontender, no distention.  Skin: Intact without lesions  or rashes.  Neurologic: Alert and oriented x 3.  Psych: Normal affect. Extremities: No clubbing or cyanosis.  HEENT: Normal.   ECG: Most recent ECG reviewed.   Labs: Lab Results  Component Value Date/Time   K 4.4 04/15/2016 09:34 AM   BUN 13 04/15/2016 09:34 AM   CREATININE 0.83 04/15/2016 09:34 AM   ALT 64 (H) 04/15/2016 09:34 AM   HGB 12.5 11/05/2010 09:00 AM     Lipids: No results found for: LDLCALC, LDLDIRECT, CHOL, TRIG, HDL      ASSESSMENT AND PLAN:  1.  Accelerated hypertension: She is currently on Bystolic 10 mg and Benicar hydrochlorothiazide 40-12.5 mg daily.  I will check a TSH and plasma renin aldosterone ratio to evaluate for secondary causes.  I will obtain a basic metabolic panel.  I will also obtain an echocardiogram to evaluate cardiac structure and function. I will start amlodipine 5 mg daily.  I have asked her to continue checking her blood pressures over the next several weeks.  2.  Chest pain: This appears atypical for ischemic heart disease in that it primarily occurs at rest.  It is also associated with severe hypertension which is likely the etiology.  As stated above, I will obtain an echocardiogram to evaluate cardiac structure and function.  I will obtain a copy of the ECG performed at her PCPs office.  3.  Leg and feet cramps: She is on hydrochlorothiazide and I wonder if she has developed hypokalemia.  I will check a basic metabolic panel.     Disposition: Follow up in 1 month  Signed: Prentice Docker, M.D., F.A.C.C.  05/21/2017, 10:14 AM

## 2017-05-21 NOTE — Patient Instructions (Signed)
Your physician recommends that you schedule a follow-up appointment in:  2 months with Dr Purvis SheffieldKoneswaran   START Amlodipine 5 mg daily  Get lab work today   Your physician has requested that you have an echocardiogram. Echocardiography is a painless test that uses sound waves to create images of your heart. It provides your doctor with information about the size and shape of your heart and how well your heart's chambers and valves are working. This procedure takes approximately one hour. There are no restrictions for this procedure.     If you need a refill on your cardiac medications before your next appointment, please call your pharmacy.     Thank you for choosing [facility] !

## 2017-05-25 ENCOUNTER — Telehealth: Payer: Self-pay | Admitting: Cardiovascular Disease

## 2017-05-25 NOTE — Telephone Encounter (Signed)
Would like to know results from lab work done Friday

## 2017-05-25 NOTE — Telephone Encounter (Signed)
Patient notified that she will be called when MD results all lab results. Patient voiced understanding.

## 2017-05-26 NOTE — Telephone Encounter (Signed)
Thyroid and renal function are normal.  Awaiting additional hormone levels.

## 2017-05-27 ENCOUNTER — Ambulatory Visit (HOSPITAL_COMMUNITY)
Admission: RE | Admit: 2017-05-27 | Discharge: 2017-05-27 | Disposition: A | Discharge status: Home / Self Care | Payer: BC Managed Care – PPO | Source: Ambulatory Visit | Attending: Cardiovascular Disease | Admitting: Cardiovascular Disease

## 2017-05-27 DIAGNOSIS — R079 Chest pain, unspecified: Secondary | ICD-10-CM | POA: Insufficient documentation

## 2017-05-27 DIAGNOSIS — I119 Hypertensive heart disease without heart failure: Secondary | ICD-10-CM | POA: Diagnosis not present

## 2017-05-27 DIAGNOSIS — I10 Essential (primary) hypertension: Secondary | ICD-10-CM

## 2017-05-27 DIAGNOSIS — Z6841 Body Mass Index (BMI) 40.0 and over, adult: Secondary | ICD-10-CM | POA: Insufficient documentation

## 2017-05-27 LAB — ALDOSTERONE + RENIN ACTIVITY W/ RATIO
ALDO / PRA Ratio: 17.6 (ref 0.0–30.0)
ALDOSTERONE: 10.6 ng/dL (ref 0.0–30.0)
PRA LC/MS/MS: 0.601 ng/mL/h (ref 0.167–5.380)

## 2017-05-27 NOTE — Progress Notes (Signed)
*  PRELIMINARY RESULTS* Echocardiogram 2D Echocardiogram has been performed.  Stacey DrainWhite, Worth Kober J 05/27/2017, 3:35 PM

## 2017-05-27 NOTE — Telephone Encounter (Signed)
Pt notified of lab results, await serum renin aldosterone

## 2017-06-17 ENCOUNTER — Ambulatory Visit: Payer: BC Managed Care – PPO | Admitting: Cardiology

## 2017-07-21 ENCOUNTER — Encounter: Payer: Self-pay | Admitting: Cardiovascular Disease

## 2017-07-21 ENCOUNTER — Ambulatory Visit: Payer: BC Managed Care – PPO | Admitting: Cardiovascular Disease

## 2017-07-21 VITALS — BP 130/70 | HR 73 | Ht 67.0 in | Wt 268.0 lb

## 2017-07-21 DIAGNOSIS — I10 Essential (primary) hypertension: Secondary | ICD-10-CM | POA: Diagnosis not present

## 2017-07-21 DIAGNOSIS — R079 Chest pain, unspecified: Secondary | ICD-10-CM | POA: Diagnosis not present

## 2017-07-21 MED ORDER — AMLODIPINE BESYLATE 5 MG PO TABS: 5.0000 mg | ORAL_TABLET | Freq: Two times a day (BID) | ORAL | 3 refills | Status: DC

## 2017-07-21 NOTE — Patient Instructions (Addendum)
Your physician wants you to follow-up in: 6 months  with Dr.Koneswaran  You will receive a reminder letter in the mail two months in advance. If you don't receive a letter, please call our office to schedule the follow-up appointment.    INCREASE Amlodipine to 5 mg TWICE A DAY   If you need a refill on your cardiac medications before your next appointment, please call your pharmacy.     No lab work or tests ordered today.      Thank you for choosing [facility] !

## 2017-07-21 NOTE — Progress Notes (Signed)
SUBJECTIVE: The patient presents for follow-up of accelerated hypertension and chest pain.  Labs 05/21/2017: Sodium 134, potassium 3.9, BUN 15, creatinine 0.76, TSH 2.2, and normal renin and aldosterone ratios.  Echocardiogram on 05/27/2017 showed normal left ventricular systolic and diastolic function and normal regional wall motion, LVEF 55 to 60%.  She brought in a blood pressure log which I reviewed.  She has had blood pressures as high as 176/96 up until 2 days ago.  It is generally elevated when she wakes up in the morning.  Chest pain frequency and intensity has substantially decreased.  She has quit drinking soda has trying to increase fruit consumption.  She does eat out fairly frequently with friends.    Review of Systems: As per "subjective", otherwise negative.  Allergies  Allergen Reactions  . Gluten Meal Swelling    Joint pain, lethargy   . Other Nausea And Vomiting    Quinoa    Current Outpatient Medications  Medication Sig Dispense Refill  . acetaminophen (TYLENOL) 500 MG tablet Take 1,000 mg by mouth 2 (two) times daily as needed for moderate pain or headache.    Marland Kitchen amLODipine (NORVASC) 5 MG tablet Take 1 tablet (5 mg total) by mouth daily. 90 tablet 3  . B Complex-C (B-COMPLEX WITH VITAMIN C) tablet Take 1 tablet by mouth daily.    . Cholecalciferol (VITAMIN D3 ADULT GUMMIES) 1000 units CHEW Chew by mouth.    . cyclobenzaprine (FLEXERIL) 10 MG tablet Take 10 mg by mouth 3 (three) times daily as needed for muscle spasms.    . Magnesium Carbonate 250 MG/GM POWD Take by mouth.    . metFORMIN (GLUCOPHAGE) 500 MG tablet Take 500 mg by mouth daily with breakfast.     . Multiple Vitamins-Calcium (ONE-A-DAY WOMENS PO) Take 1 tablet by mouth daily.    . nebivolol (BYSTOLIC) 10 MG tablet Take 10 mg by mouth daily.    . norethindrone (MICRONOR,CAMILA,ERRIN) 0.35 MG tablet Take 1 tablet by mouth daily.    Marland Kitchen olmesartan-hydrochlorothiazide (BENICAR HCT) 40-12.5 MG tablet  Take 1 tablet by mouth daily.      No current facility-administered medications for this visit.     Past Medical History:  Diagnosis Date  . Hypertension   . Kidney stone   . PONV (postoperative nausea and vomiting)     Past Surgical History:  Procedure Laterality Date  . CARPAL TUNNEL RELEASE  2005 and 2006   bilateral    also cubital tunnel  . CHOLECYSTECTOMY  11/07/2010   Procedure: LAPAROSCOPIC CHOLECYSTECTOMY;  Surgeon: Fabio Bering;  Location: [facility];  Service: General;  Laterality: N/A;  . COLONOSCOPY N/A 04/30/2016   Procedure: COLONOSCOPY;  Surgeon: West Bali, MD;  Location: [facility];  Service: Endoscopy;  Laterality: N/A;  9:45am  . URETHRA SURGERY  age 37  . WISDOM TOOTH EXTRACTION  2001    Social History   Socioeconomic History  . Marital status: Married    Spouse name: Not on file  . Number of children: Not on file  . Years of education: Not on file  . Highest education level: Not on file  Occupational History  . Not on file  Social Needs  . Financial resource strain: Not on file  . Food insecurity:    Worry: Not on file    Inability: Not on file  . Transportation needs:    Medical: Not on file    Non-medical: Not on file  Tobacco Use  .  Smoking status: Never Smoker  . Smokeless tobacco: Never Used  Substance and Sexual Activity  . Alcohol use: No  . Drug use: No  . Sexual activity: Yes    Birth control/protection: Pill  Lifestyle  . Physical activity:    Days per week: Not on file    Minutes per session: Not on file  . Stress: Not on file  Relationships  . Social connections:    Talks on phone: Not on file    Gets together: Not on file    Attends religious service: Not on file    Active member of club or organization: Not on file    Attends meetings of clubs or organizations: Not on file    Relationship status: Not on file  . Intimate partner violence:    Fear of current or ex partner: Not on file    Emotionally abused: Not on  file    Physically abused: Not on file    Forced sexual activity: Not on file  Other Topics Concern  . Not on file  Social History Narrative  . Not on file     Vitals:   07/21/17 1039  BP: 130/70  Pulse: 73  SpO2: 98%  Weight: 268 lb (121.6 kg)  Height:  (1.702 m)    Wt Readings from Last 3 Encounters:  07/21/17 268 lb (121.6 kg)  05/21/17 267 lb (121.1 kg)  07/02/16 279 lb 12.8 oz (126.9 kg)     PHYSICAL EXAM General: NAD HEENT: Normal. Neck: No JVD, no thyromegaly. Lungs: Clear to auscultation bilaterally with normal respiratory effort. CV: Regular rate and rhythm, normal S1/S2, no S3/S4, no murmur. No pretibial or periankle edema.     Abdomen: Soft, nontender, no distention.  Neurologic: Alert and oriented.  Psych: Normal affect. Skin: Normal. Musculoskeletal: No gross deformities.    ECG: Most recent ECG reviewed.   Labs: Lab Results  Component Value Date/Time   K 3.9 05/21/2017 11:08 AM   BUN 15 05/21/2017 11:08 AM   CREATININE 0.76 05/21/2017 11:08 AM   CREATININE 0.83 04/15/2016 09:34 AM   ALT 64 (H) 04/15/2016 09:34 AM   TSH 2.230 05/21/2017 11:08 AM   HGB 12.5 11/05/2010 09:00 AM     Lipids: No results found for: LDLCALC, LDLDIRECT, CHOL, TRIG, HDL     ASSESSMENT AND PLAN:  1.  Accelerated hypertension: She is currently on Bystolic 10 mg, amlodipine 5 mg, and Benicar hydrochlorothiazide 40-12.5 mg daily.  As indicated above, her blood pressure log demonstrates uncontrolled hypertension.  I will increase amlodipine to 5 mg twice daily.  I also had a long discussion with her about decreasing sodium consumption and increasing vegetable consumption.  We also talked about weight loss and regular physical activity.  2.  Chest pain: This appears atypical for ischemic heart disease in that it primarily occurs at rest.  It is also associated with severe hypertension which is likely the etiology.    It has substantially decreased both in frequency  and intensity.     Disposition: Follow up 6 months   Prentice Docker, M.D., F.A.C.C.

## 2017-11-02 ENCOUNTER — Other Ambulatory Visit: Payer: Self-pay | Admitting: Obstetrics and Gynecology

## 2017-11-02 DIAGNOSIS — R928 Other abnormal and inconclusive findings on diagnostic imaging of breast: Secondary | ICD-10-CM

## 2017-11-10 ENCOUNTER — Other Ambulatory Visit: Payer: BC Managed Care – PPO

## 2017-11-12 ENCOUNTER — Ambulatory Visit
Admission: RE | Admit: 2017-11-12 | Discharge: 2017-11-12 | Disposition: A | Discharge status: Home / Self Care | Payer: BC Managed Care – PPO | Source: Ambulatory Visit | Attending: Obstetrics and Gynecology | Admitting: Obstetrics and Gynecology

## 2017-11-12 ENCOUNTER — Other Ambulatory Visit: Payer: Self-pay | Admitting: Obstetrics and Gynecology

## 2017-11-12 DIAGNOSIS — N6489 Other specified disorders of breast: Secondary | ICD-10-CM

## 2017-11-12 DIAGNOSIS — R928 Other abnormal and inconclusive findings on diagnostic imaging of breast: Secondary | ICD-10-CM

## 2018-02-16 IMAGING — US US ABDOMEN LIMITED
1 series · 14 of 25 positions shown · non-contrast
Comparison: Abdominal ultrasound October 16, 2010

CLINICAL DATA: Elevated liver function studies, possible fatty
liver disease. Previous cholecystectomy.

EXAM:
US ABDOMEN LIMITED - RIGHT UPPER QUADRANT

[Series 1: us abdomen limited · 0.27mm/px · 14 of 31 slices shown]
[im 1/31]
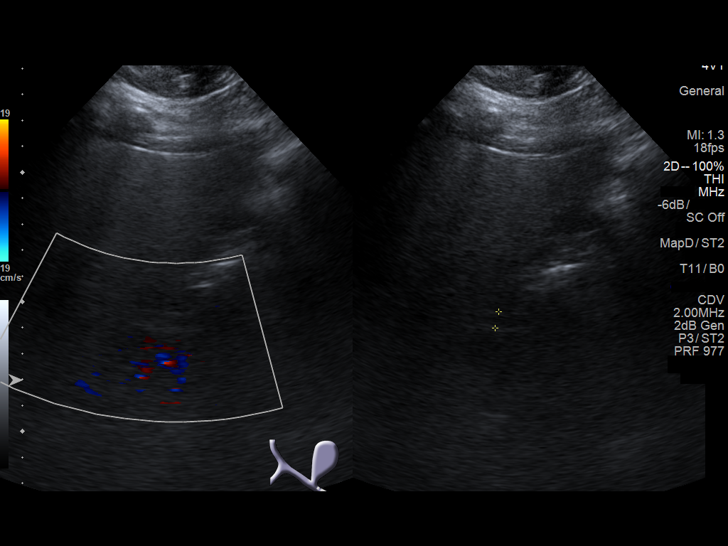
[im 3/31]
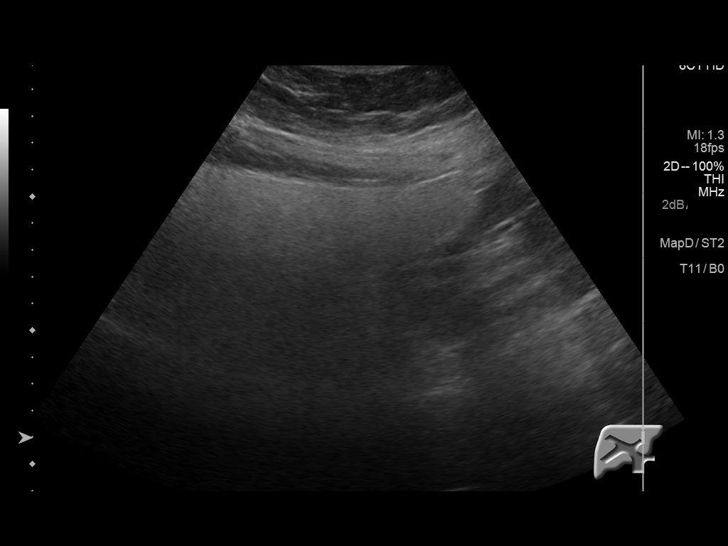
[im 6/31]
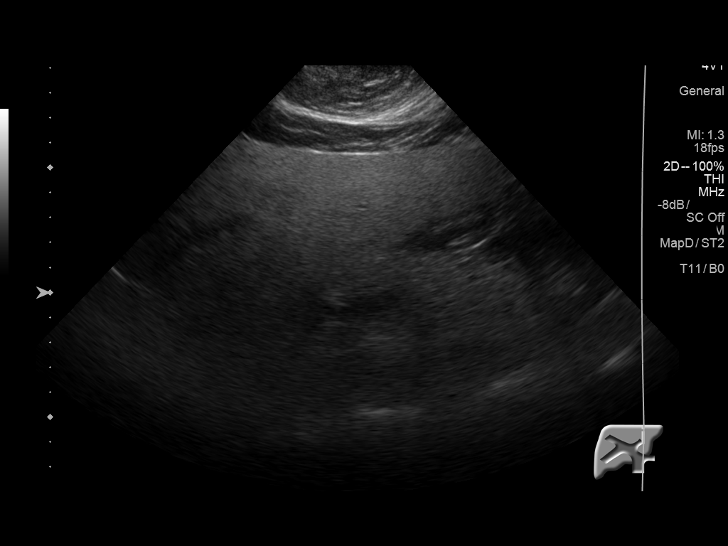
[im 8/31]
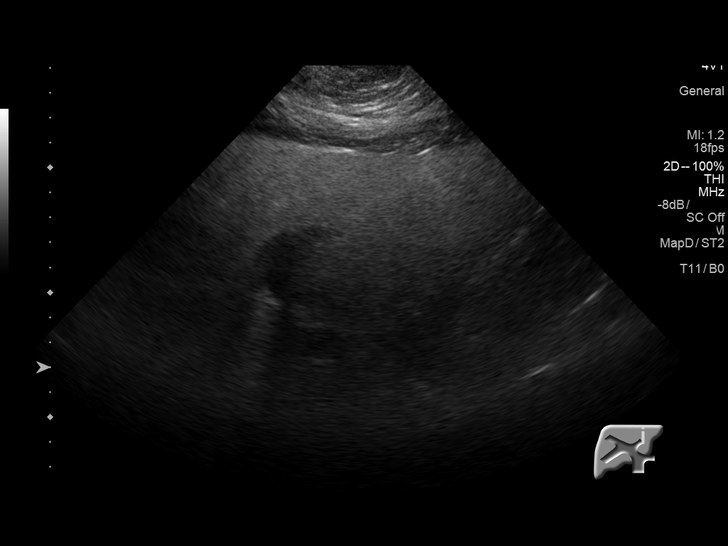
[im 11/31]
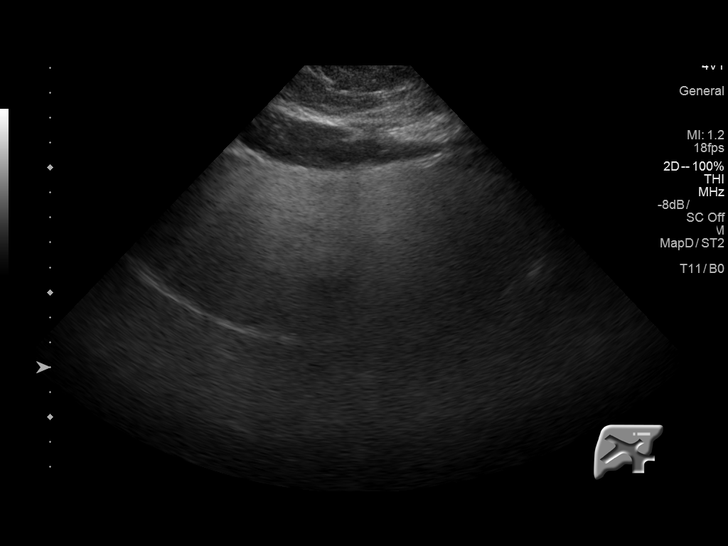
[im 12/31]
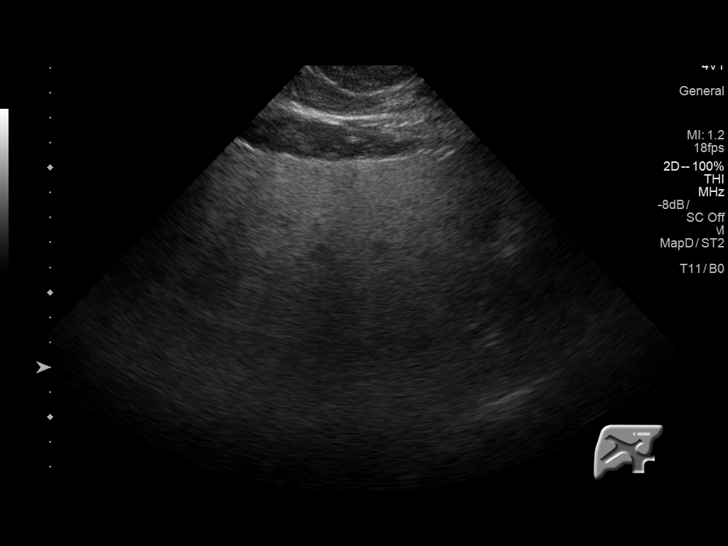
[im 14/31]
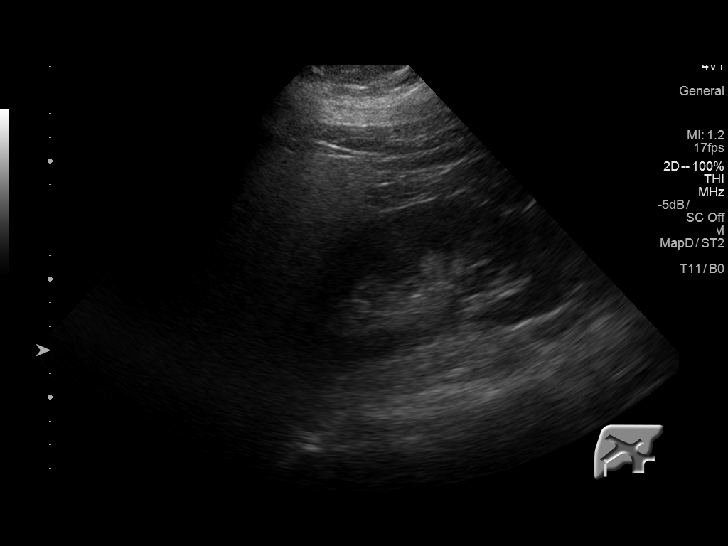
[im 17/31]
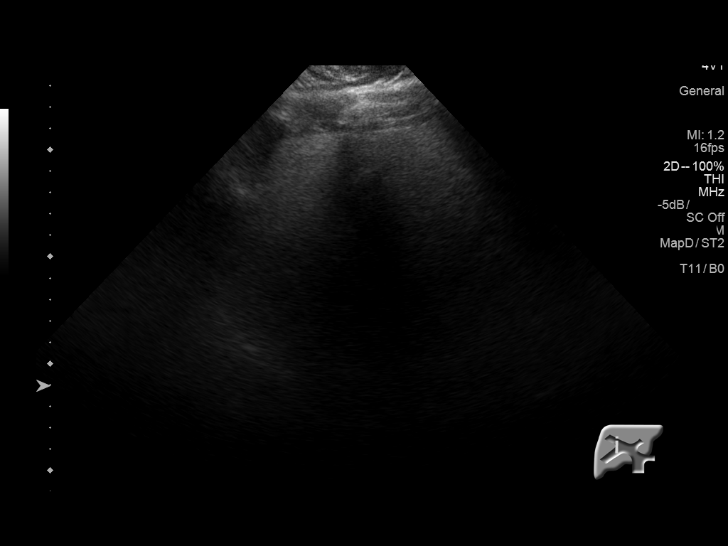
[im 19/31]
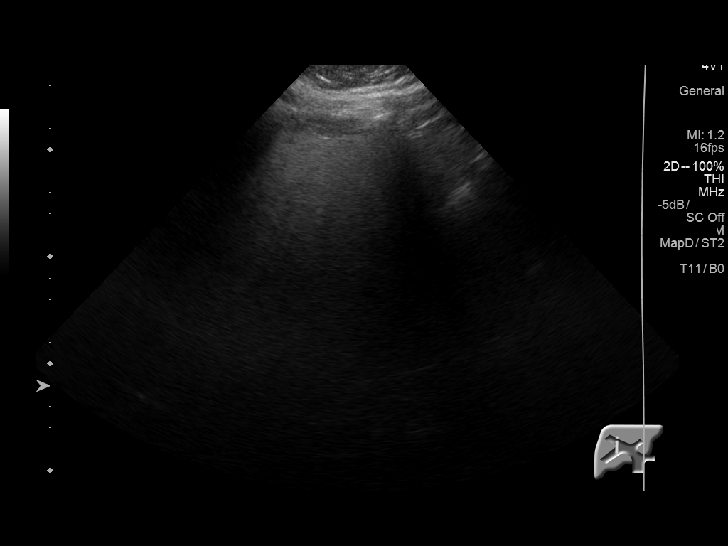
[im 21/31]
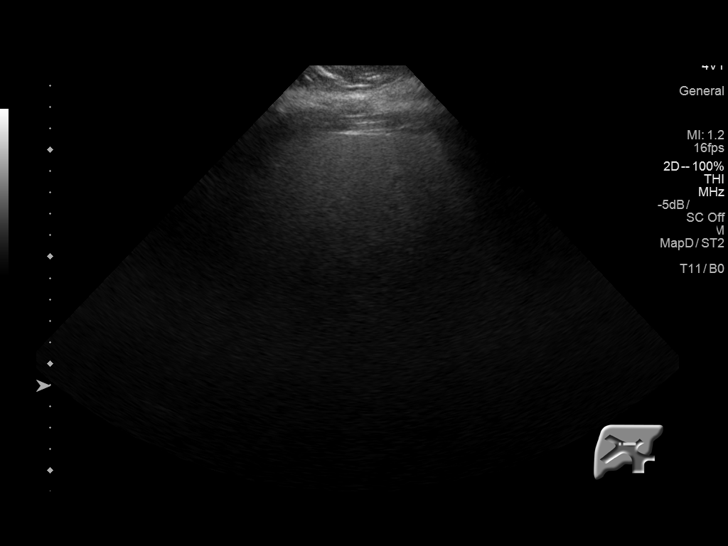
[im 23/31]
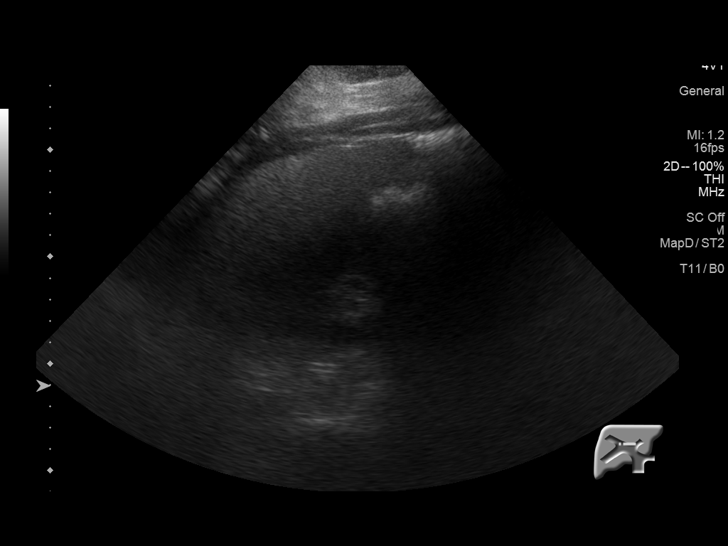
[im 26/31]
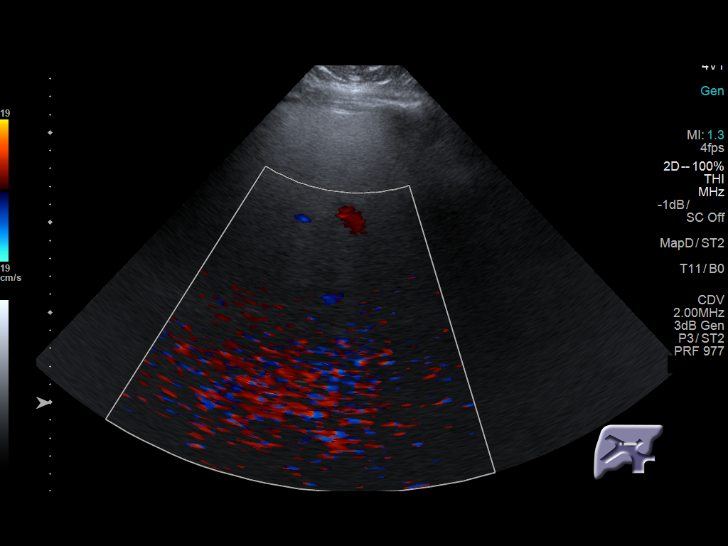
[im 28/31]
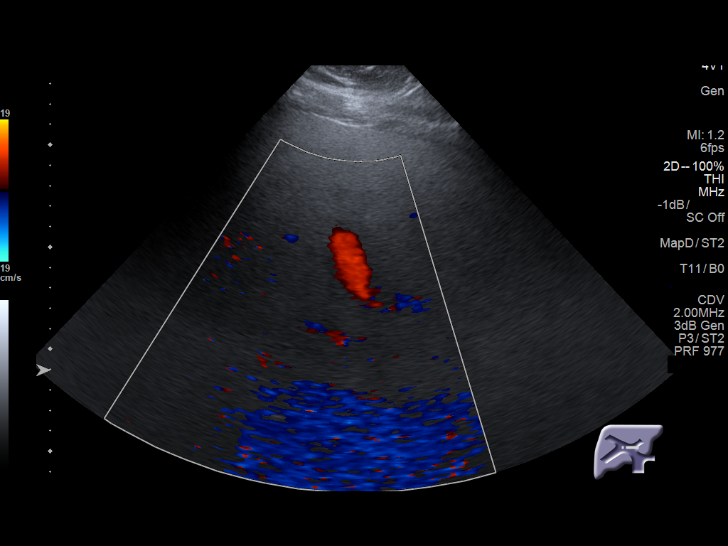
[im 31/31]
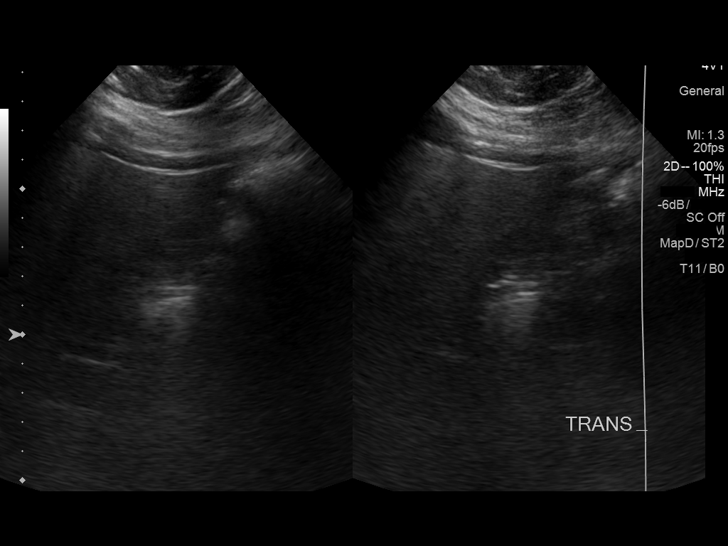

[14 of 25 positions shown; findings below may reference images not displayed]

FINDINGS: Gallbladder:

The gallbladder is surgically absent.

Common bile duct:

Diameter: 7 mm

Liver:

The liver is subjectively enlarged. The parenchymal echotexture is
mildly increased. The surface contour is smooth. There is no
discrete mass nor intrahepatic ductal dilation.
IMPRESSION: Prominent liver likely consistent with the patient's body habitus.
Increased echotexture as described previously compatible with fatty
infiltration. No focal mass or ductal dilation.

## 2018-05-26 ENCOUNTER — Telehealth: Payer: Self-pay

## 2018-05-26 NOTE — Telephone Encounter (Signed)
   Cardiac Questionnaire:    Since your last visit or hospitalization:    1. Have you been having new or worsening chest pain? no   2. Have you been having new or worsening shortness of breath? no 3. Have you been having new or worsening leg swelling, wt gain, or increase in abdominal girth (pants fitting more tightly)? no   4. Have you had any passing out spells? no  Patient doing well, needs no refills at this time, gladly r/s her apt till August

## 2018-05-31 ENCOUNTER — Ambulatory Visit: Payer: BC Managed Care – PPO | Admitting: Cardiovascular Disease

## 2018-06-10 ENCOUNTER — Other Ambulatory Visit: Payer: Self-pay

## 2018-06-10 ENCOUNTER — Ambulatory Visit
Admission: RE | Admit: 2018-06-10 | Discharge: 2018-06-10 | Disposition: A | Discharge status: Home / Self Care | Payer: BC Managed Care – PPO | Source: Ambulatory Visit | Attending: Obstetrics and Gynecology | Admitting: Obstetrics and Gynecology

## 2018-06-10 DIAGNOSIS — N6489 Other specified disorders of breast: Secondary | ICD-10-CM

## 2018-09-26 ENCOUNTER — Encounter: Payer: Self-pay | Admitting: Cardiovascular Disease

## 2018-09-26 ENCOUNTER — Ambulatory Visit: Payer: BC Managed Care – PPO | Admitting: Cardiovascular Disease

## 2018-09-26 ENCOUNTER — Other Ambulatory Visit: Payer: Self-pay

## 2018-09-26 VITALS — BP 146/94 | HR 83 | Temp 97.5°F | Ht 67.0 in | Wt 269.0 lb

## 2018-09-26 DIAGNOSIS — R079 Chest pain, unspecified: Secondary | ICD-10-CM | POA: Diagnosis not present

## 2018-09-26 DIAGNOSIS — I10 Essential (primary) hypertension: Secondary | ICD-10-CM | POA: Diagnosis not present

## 2018-09-26 MED ORDER — AMLODIPINE BESYLATE 5 MG PO TABS: 5.0000 mg | ORAL_TABLET | Freq: Two times a day (BID) | ORAL | 3 refills | Status: DC

## 2018-09-26 MED ORDER — OLMESARTAN MEDOXOMIL-HCTZ 40-12.5 MG PO TABS: 1.0000 | ORAL_TABLET | Freq: Every day | ORAL | 3 refills | Status: DC

## 2018-09-26 MED ORDER — NEBIVOLOL HCL 10 MG PO TABS: 10.0000 mg | ORAL_TABLET | Freq: Every day | ORAL | 3 refills | Status: DC

## 2018-09-26 NOTE — Progress Notes (Signed)
SUBJECTIVE: The patient presents for follow-up of hypertension and chest pain.  Echocardiogram on 05/27/2017 showed normal left ventricular systolic and diastolic function and normal regional wall motion, LVEF 55 to 60%.  I increased her amlodipine to 5 mg twice daily at her last visit with me on 07/21/2017 but she is currently taking 5 mg daily.  She told me she may have forgotten to take all of her antihypertensive medications this morning because she was in a rash and she has been stressed due to work.  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  She has had some left shoulder musculoskeletal issues and has been seeing a Restaurant manager, fast food.  Left shoulder pain can radiate to the chest and as her shoulder pain is improved so has her chest pain.  She has used a variety of muscle relaxants as well as ice packs.  She denies exertional chest pain and dyspnea as well as leg swelling, orthopnea, and paroxysmal nocturnal dyspnea.  She checks her blood pressure at home and it runs in the 130/90 range.  She plans on making lifestyle changes including cessation of caffeinated soda.    Review of Systems: As per "subjective", otherwise negative.  Allergies  Allergen Reactions  . Gluten Meal Swelling    Joint pain, lethargy   . Other Nausea And Vomiting    Quinoa    Current Outpatient Medications  Medication Sig Dispense Refill  . acetaminophen (TYLENOL) 500 MG tablet Take 1,000 mg by mouth 2 (two) times daily as needed for moderate pain or headache.    Marland Kitchen amLODipine (NORVASC) 5 MG tablet Take 1 tablet (5 mg total) by mouth 2 (two) times daily. (Patient taking differently: Take 5 mg by mouth daily. ) 180 tablet 3  . B Complex-C (B-COMPLEX WITH VITAMIN C) tablet Take 1 tablet by mouth daily.    . Cholecalciferol (VITAMIN D3 ADULT GUMMIES) 1000 units CHEW Chew by mouth.    . cyclobenzaprine (FLEXERIL) 10 MG  tablet Take 10 mg by mouth 3 (three) times daily as needed for muscle spasms.    Marland Kitchen JANUMET XR [phone] MG TB24 Take 1 tablet by mouth daily.    . Magnesium Carbonate 250 MG/GM POWD Take by mouth.    . Multiple Vitamins-Calcium (ONE-A-DAY WOMENS PO) Take 1 tablet by mouth daily.    . nebivolol (BYSTOLIC) 10 MG tablet Take 10 mg by mouth daily.    . norethindrone (MICRONOR,CAMILA,ERRIN) 0.35 MG tablet Take 1 tablet by mouth daily.    Marland Kitchen olmesartan-hydrochlorothiazide (BENICAR HCT) 40-12.5 MG tablet Take 1 tablet by mouth daily.      No current facility-administered medications for this visit.     Past Medical History:  Diagnosis Date  . Hypertension   . Kidney stone   . PONV (postoperative nausea and vomiting)     Past Surgical History:  Procedure Laterality Date  . CARPAL TUNNEL RELEASE  2005 and 2006   bilateral    also cubital tunnel  . CHOLECYSTECTOMY  11/07/2010   Procedure: LAPAROSCOPIC CHOLECYSTECTOMY;  Surgeon: Donato Heinz;  Location: [facility];  Service: General;  Laterality: N/A;  . COLONOSCOPY N/A 04/30/2016   Procedure: COLONOSCOPY;  Surgeon: Danie Binder, MD;  Location: [facility];  Service: Endoscopy;  Laterality: N/A;  9:45am  . URETHRA SURGERY  age 15  . WISDOM TOOTH EXTRACTION  2001    Social History   Socioeconomic History  .  Marital status: Married    Spouse name: Not on file  . Number of children: Not on file  . Years of education: Not on file  . Highest education level: Not on file  Occupational History  . Not on file  Social Needs  . Financial resource strain: Not on file  . Food insecurity    Worry: Not on file    Inability: Not on file  . Transportation needs    Medical: Not on file    Non-medical: Not on file  Tobacco Use  . Smoking status: Never Smoker  . Smokeless tobacco: Never Used  Substance and Sexual Activity  . Alcohol use: No  . Drug use: No  . Sexual activity: Yes    Birth control/protection: Pill  Lifestyle  . Physical  activity    Days per week: Not on file    Minutes per session: Not on file  . Stress: Not on file  Relationships  . Social Musicianconnections    Talks on phone: Not on file    Gets together: Not on file    Attends religious service: Not on file    Active member of club or organization: Not on file    Attends meetings of clubs or organizations: Not on file    Relationship status: Not on file  . Intimate partner violence    Fear of current or ex partner: Not on file    Emotionally abused: Not on file    Physically abused: Not on file    Forced sexual activity: Not on file  Other Topics Concern  . Not on file  Social History Narrative  . Not on file     Vitals:   09/26/18 1335  BP: (!) 146/94  Pulse: 83  Temp: (!) 97.5 F (36.4 C)  Weight: 269 lb (122 kg)  Height: 5\' 7"  (1.702 m)    Wt Readings from Last 3 Encounters:  09/26/18 269 lb (122 kg)  07/21/17 268 lb (121.6 kg)  05/21/17 267 lb (121.1 kg)     PHYSICAL EXAM General: NAD HEENT: Normal. Neck: No JVD, no thyromegaly. Lungs: Clear to auscultation bilaterally with normal respiratory effort. CV: Regular rate and rhythm, normal S1/S2, no S3/S4, no murmur. No pretibial or periankle edema.  No carotid bruit.   Abdomen: Soft, nontender, no distention.  Neurologic: Alert and oriented.  Psych: Normal affect. Skin: Normal. Musculoskeletal: No gross deformities.    ECG: Reviewed above under Subjective   Labs: Lab Results  Component Value Date/Time   K 3.9 05/21/2017 11:08 AM   BUN 15 05/21/2017 11:08 AM   CREATININE 0.76 05/21/2017 11:08 AM   CREATININE 0.83 04/15/2016 09:34 AM   ALT 64 (H) 04/15/2016 09:34 AM   TSH 2.230 05/21/2017 11:08 AM   HGB 12.5 11/05/2010 09:00 AM     Lipids: No results found for: LDLCALC, LDLDIRECT, CHOL, TRIG, HDL     ASSESSMENT AND PLAN: 1.  Hypertension: Blood pressure is elevated but she thinks she forgot to take her antihypertensive medications this morning. I increased her  amlodipine to 5 mg twice daily at her last visit with me on 07/21/2017 but she is currently taking 5 mg daily.  She is also on nebivolol 10 mg daily and Benicar HCT 40-12.5 mg daily.  Blood pressures at home are in the 130/90 range.  She plans on making several lifestyle changes including the elimination of caffeinated soda.  I told her that optimally her blood pressure should be 130/80 or less.  2.  Chest pain: This appears to be musculoskeletal in etiology and related to her left shoulder pain.  She denies exertional symptoms.    Disposition: Follow up 1 year   Prentice DockerSuresh Varie Machamer, M.D., F.A.C.C.

## 2018-09-26 NOTE — Patient Instructions (Signed)
Medication Instructions: Your physician recommends that you continue on your current medications as directed. Please refer to the Current Medication list given to you today.   Labwork: None today  Procedures/Testing: None today  Follow-Up: 1 year with Dr.Koneswaran  Any Additional Special Instructions Will Be Listed Below (If Applicable).     If you need a refill on your cardiac medications before your next appointment, please call your pharmacy.     Thank you for choosing [facility] !        

## 2018-12-09 ENCOUNTER — Other Ambulatory Visit: Payer: Self-pay | Admitting: Obstetrics and Gynecology

## 2018-12-09 DIAGNOSIS — N6452 Nipple discharge: Secondary | ICD-10-CM

## 2018-12-09 DIAGNOSIS — N644 Mastodynia: Secondary | ICD-10-CM

## 2018-12-14 ENCOUNTER — Other Ambulatory Visit: Payer: Self-pay

## 2018-12-14 ENCOUNTER — Ambulatory Visit
Admission: RE | Admit: 2018-12-14 | Discharge: 2018-12-14 | Disposition: A | Discharge status: Home / Self Care | Payer: BC Managed Care – PPO | Source: Ambulatory Visit | Attending: Obstetrics and Gynecology | Admitting: Obstetrics and Gynecology

## 2018-12-14 ENCOUNTER — Ambulatory Visit: Payer: BC Managed Care – PPO

## 2018-12-14 DIAGNOSIS — N644 Mastodynia: Secondary | ICD-10-CM

## 2019-01-08 ENCOUNTER — Other Ambulatory Visit: Payer: Self-pay | Admitting: Cardiovascular Disease

## 2019-02-24 ENCOUNTER — Other Ambulatory Visit: Payer: Self-pay

## 2019-02-24 ENCOUNTER — Ambulatory Visit
Admission: EM | Admit: 2019-02-24 | Discharge: 2019-02-24 | Disposition: A | Discharge status: Home / Self Care | Payer: BC Managed Care – PPO | Attending: Emergency Medicine | Admitting: Emergency Medicine

## 2019-02-24 ENCOUNTER — Ambulatory Visit (INDEPENDENT_AMBULATORY_CARE_PROVIDER_SITE_OTHER): Payer: BC Managed Care – PPO

## 2019-02-24 DIAGNOSIS — M25512 Pain in left shoulder: Secondary | ICD-10-CM | POA: Diagnosis not present

## 2019-02-24 MED ORDER — PREDNISONE 10 MG PO TABS: 20.0000 mg | ORAL_TABLET | Freq: Every day | ORAL | 0 refills | Status: DC

## 2019-02-24 MED ORDER — CYCLOBENZAPRINE HCL 10 MG PO TABS: 10.0000 mg | ORAL_TABLET | Freq: Three times a day (TID) | ORAL | 0 refills | Status: DC

## 2019-02-24 MED ORDER — IBUPROFEN 800 MG PO TABS: 800.0000 mg | ORAL_TABLET | Freq: Three times a day (TID) | ORAL | 0 refills | Status: DC

## 2019-02-24 NOTE — ED Provider Notes (Signed)
[facility]    CSN: [id number] Arrival date & time: 02/24/19  1043      History   Chief Complaint Chief Complaint  Patient presents with  . left shoulder pain    HPI Emily Colon is a 39 y.o. female.   Emily Colon 75 y old female presented  to the urgent care with a complaint of left shoulder pain that began 4 days  ago.  It develop gradually and patient believe it could be related to lifting. She localizes the pain to the left Shoulder. She describes the pain as constant and achy and rated at 7 on a scale of 1-10. She has tried OTC tylenol medications without relief. Report similar symptom in the past and was prescribed pain medication and muscle relaxer. Her symptoms are made worse with movement and lifting heavy weight at the gym.    The history is provided by the patient. No language interpreter was used.    Past Medical History:  Diagnosis Date  . Hypertension   . Kidney stone   . PONV (postoperative nausea and vomiting)     Patient Active Problem List   Diagnosis Date Noted  . Elevated blood pressure reading 07/02/2016  . Elevated LFTs 04/15/2016  . Family history of colonic polyps 04/15/2016  . Rectal bleeding 04/15/2016  . POLYCYSTIC OVARIAN DISEASE 12/20/2006  . CARPAL TUNNEL SYNDROME 12/20/2006    Past Surgical History:  Procedure Laterality Date  . CARPAL TUNNEL RELEASE  2005 and 2006   bilateral    also cubital tunnel  . CHOLECYSTECTOMY  11/07/2010   Procedure: LAPAROSCOPIC CHOLECYSTECTOMY;  Surgeon: Donato Heinz;  Location: [facility];  Service: General;  Laterality: N/A;  . COLONOSCOPY N/A 04/30/2016   Procedure: COLONOSCOPY;  Surgeon: Danie Binder, MD;  Location: [facility];  Service: Endoscopy;  Laterality: N/A;  9:45am  . URETHRA SURGERY  age 18  . WISDOM TOOTH EXTRACTION  2001    OB History   No obstetric history on file.      Home Medications    Prior to Admission medications   Medication Sig Start Date End Date  Taking? Authorizing Provider  acetaminophen (TYLENOL) 500 MG tablet Take 1,000 mg by mouth 2 (two) times daily as needed for moderate pain or headache.    [provider]  amLODipine (NORVASC) 5 MG tablet Take 1 tablet by mouth twice daily 01/09/19   Herminio Commons, MD  B Complex-C (B-COMPLEX WITH VITAMIN C) tablet Take 1 tablet by mouth daily.    [provider]  Cholecalciferol (VITAMIN D3 ADULT GUMMIES) 1000 units CHEW Chew by mouth.    [provider]  cyclobenzaprine (FLEXERIL) 10 MG tablet Take 1 tablet (10 mg total) by mouth 3 (three) times daily. 02/24/19   Kimmy Totten, Darrelyn Hillock, FNP  ibuprofen (ADVIL) 800 MG tablet Take 1 tablet (800 mg total) by mouth 3 (three) times daily. Take with food 02/24/19   Maecy Podgurski, Darrelyn Hillock, FNP  JANUMET XR [phone] MG TB24 Take 1 tablet by mouth daily. 09/02/18   [provider]  Magnesium Carbonate 250 MG/GM POWD Take by mouth.    [provider]  Multiple Vitamins-Calcium (ONE-A-DAY WOMENS PO) Take 1 tablet by mouth daily.    [provider]  nebivolol (BYSTOLIC) 10 MG tablet Take 1 tablet (10 mg total) by mouth daily. 09/26/18   Herminio Commons, MD  norethindrone (MICRONOR,CAMILA,ERRIN) 0.35 MG tablet Take 1 tablet by mouth daily.    [provider]  olmesartan-hydrochlorothiazide (BENICAR HCT) 40-12.5 MG tablet Take 1 tablet by mouth daily. 09/26/18   Laqueta Linden, MD  predniSONE (DELTASONE) 10 MG tablet Take 2 tablets (20 mg total) by mouth daily. 02/24/19   AvegnoZachery Dakins, FNP    Family History Family History  Problem Relation Age of Onset  . Prostate cancer Father   . Breast cancer Maternal Grandfather   . Anesthesia problems Neg Hx   . Hypotension Neg Hx   . Malignant hyperthermia Neg Hx   . Pseudochol deficiency Neg Hx   . Colon cancer Neg Hx     Social History Social History   Tobacco Use  . Smoking status: Never Smoker  . Smokeless tobacco: Never Used  Substance  Use Topics  . Alcohol use: No  . Drug use: No     Allergies   Gluten meal and Other   Review of Systems Review of Systems  Constitutional: Negative.   Respiratory: Negative.   Cardiovascular: Negative.   Neurological: Negative.   ROS: All other are negatives   Physical Exam Triage Vital Signs ED Triage Vitals  Enc Vitals Group     BP 02/24/19 1051 (!) 162/104     Pulse Rate 02/24/19 1051 77     Resp 02/24/19 1051 16     Temp 02/24/19 1051 98.7 F (37.1 C)     Temp Source 02/24/19 1051 Oral     SpO2 02/24/19 1051 98 %     Weight --      Height --      Head Circumference --      Peak Flow --      Pain Score 02/24/19 1058 9     Pain Loc --      Pain Edu? --      Excl. in GC? --    No data found.  Updated Vital Signs BP (!) 162/104 (BP Location: Right Arm)   Pulse 77   Temp 98.7 F (37.1 C) (Oral)   Resp 16   SpO2 98%   Visual Acuity Right Eye Distance:   Left Eye Distance:   Bilateral Distance:    Right Eye Near:   Left Eye Near:    Bilateral Near:     Physical Exam Vitals and nursing note reviewed.  Constitutional:      General: She is not in acute distress.    Appearance: Normal appearance. She is not ill-appearing or toxic-appearing.  Cardiovascular:     Rate and Rhythm: Tachycardia present.     Pulses: Normal pulses.     Heart sounds: Normal heart sounds. No murmur.  Pulmonary:     Effort: Pulmonary effort is normal. No respiratory distress.     Breath sounds: Normal breath sounds. No wheezing.  Chest:     Chest wall: No tenderness.  Musculoskeletal:        General: Tenderness present. No deformity or signs of injury.     Right shoulder: Normal.     Left shoulder: Tenderness present. No swelling, deformity, effusion or laceration. Normal range of motion. Normal strength.  Neurological:     General: No focal deficit present.     Mental Status: She is alert and oriented to person, place, and time.     Cranial Nerves: Cranial nerves are  intact. No cranial nerve deficit.     Sensory: Sensation is intact. No sensory deficit.     Motor: Motor function is intact. No weakness.     Coordination: Coordination  is intact.     Gait: Gait is intact.      UC Treatments / Results  Labs (all labs ordered are listed, but only abnormal results are displayed) Labs Reviewed - No data to display  EKG   Radiology DG Shoulder Left  Result Date: 02/24/2019 CLINICAL DATA:  Shoulder pain for 1 year. Worsening over last 3 days. EXAM: LEFT SHOULDER - 2+ VIEW COMPARISON:  None. FINDINGS: There is no evidence of fracture or dislocation. There is no evidence of arthropathy or other focal bone abnormality. Soft tissues are unremarkable. IMPRESSION: Negative. Electronically Signed   By: Signa Kell M.D.   On: 02/24/2019 11:30    Procedures Procedures (including critical care time)  Medications Ordered in UC Medications - No data to display  Initial Impression / Assessment and Plan / UC Course  I have reviewed the triage vital signs and the nursing notes.  Pertinent labs & imaging results that were available during my care of the patient were reviewed by me and considered in my medical decision making (see chart for details).   Shoulder x-ray was ordered and result reviewred. Left shoulder x-rays show no fracture or dislocation. Patient is stable for discharge.  Advised patient to follow-up with primary care for referral to neurology.  Advised patient to take medication as prescribed.  Patient verbalized understanding of the plan of care.  Final Clinical Impressions(s) / UC Diagnoses   Final diagnoses:  Left shoulder pain, unspecified chronicity     Discharge Instructions     your left shoulder x-ray showed no fracture or dislocation. Advised patient to take the medication as prescribed Advised patient to take Flexeril as prescribed.  Patient was advised to it may make her sleepy Short-term corticosteroids prescribed Follow-up  with primary care for referral to neurology.    ED Prescriptions    Medication Sig Dispense Auth. Provider   ibuprofen (ADVIL) 800 MG tablet Take 1 tablet (800 mg total) by mouth 3 (three) times daily. Take with food 21 tablet Sharlyne Koeneman S, FNP   predniSONE (DELTASONE) 10 MG tablet Take 2 tablets (20 mg total) by mouth daily. 15 tablet Kaylee Wombles, Zachery Dakins, FNP   cyclobenzaprine (FLEXERIL) 10 MG tablet Take 1 tablet (10 mg total) by mouth 3 (three) times daily. 30 tablet Delara Shepheard, Zachery Dakins, FNP     PDMP not reviewed this encounter.   Durward Parcel, FNP 02/24/19 1150

## 2019-02-24 NOTE — Discharge Instructions (Addendum)
your left shoulder x-ray showed no fracture or dislocation. Advised patient to take the medication as prescribed Advised patient to take Flexeril as prescribed.  Patient was advised to it may make her sleepy Short-term corticosteroids prescribed Follow-up with primary care for referral to neurology.

## 2019-02-24 NOTE — ED Triage Notes (Signed)
Pt presents to UC w/ c/o left shoulder pain. Pt states she has been having left shoulder pain x1 year, denies injury. Pt states the pain is worse today than it's ever been. Pt has FROM of left shoulder. Pt states she has been to chiropractors w/o relief of pain. Pt states she took a muscle relaxer this morning which slightly helped w/ the pain. Pt states pain is going down left arm now but denies numbness or tingling.

## 2019-03-07 ENCOUNTER — Other Ambulatory Visit: Payer: Self-pay | Admitting: Neurology

## 2019-03-07 ENCOUNTER — Other Ambulatory Visit (HOSPITAL_COMMUNITY): Payer: Self-pay | Admitting: Neurology

## 2019-03-07 ENCOUNTER — Other Ambulatory Visit: Payer: Self-pay | Admitting: Orthopedic Surgery

## 2019-03-07 DIAGNOSIS — M5412 Radiculopathy, cervical region: Secondary | ICD-10-CM

## 2019-03-15 ENCOUNTER — Other Ambulatory Visit: Payer: Self-pay

## 2019-03-15 ENCOUNTER — Ambulatory Visit (HOSPITAL_COMMUNITY)
Admission: RE | Admit: 2019-03-15 | Discharge: 2019-03-15 | Disposition: A | Discharge status: Home / Self Care | Payer: BC Managed Care – PPO | Source: Ambulatory Visit | Attending: Orthopedic Surgery | Admitting: Orthopedic Surgery

## 2019-03-15 DIAGNOSIS — M5412 Radiculopathy, cervical region: Secondary | ICD-10-CM | POA: Insufficient documentation

## 2019-04-12 ENCOUNTER — Telehealth (HOSPITAL_COMMUNITY): Payer: Self-pay | Admitting: Physical Therapy

## 2019-04-12 ENCOUNTER — Other Ambulatory Visit: Payer: Self-pay

## 2019-04-12 ENCOUNTER — Ambulatory Visit (HOSPITAL_COMMUNITY): Payer: BC Managed Care – PPO | Attending: Physical Medicine and Rehabilitation | Admitting: Physical Therapy

## 2019-04-12 ENCOUNTER — Encounter (HOSPITAL_COMMUNITY): Payer: Self-pay | Admitting: Physical Therapy

## 2019-04-12 DIAGNOSIS — M542 Cervicalgia: Secondary | ICD-10-CM | POA: Insufficient documentation

## 2019-04-12 DIAGNOSIS — M5412 Radiculopathy, cervical region: Secondary | ICD-10-CM | POA: Diagnosis present

## 2019-04-12 NOTE — Telephone Encounter (Signed)
Called pt to req to come in earlier today due to cx-lations -she can not come in any earlier-SOrry

## 2019-04-12 NOTE — Therapy (Signed)
[facility] [address] [place], [place], [address] Phone: [phone]   Fax:  [phone]  Physical Therapy Evaluation  Patient Details  Name: Emily Colon MRN: [id number] Date of Birth: 16-Oct-1980 Referring Provider (PT): Emily Colon    Encounter Date: 04/12/2019  PT End of Session - 04/12/19 1620    Visit Number  1    Number of Visits  12    Date for PT Re-Evaluation  05/24/19    Authorization Type  bcbs    Authorization - Visit Number  1    Authorization - Number of Visits  12    PT Start Time  1530    PT Stop Time  1614    PT Time Calculation (min)  44 min       Past Medical History:  Diagnosis Date  . Hypertension   . Kidney stone   . PONV (postoperative nausea and vomiting)     Past Surgical History:  Procedure Laterality Date  . CARPAL TUNNEL RELEASE  2005 and 2006   bilateral    also cubital tunnel  . CHOLECYSTECTOMY  11/07/2010   Procedure: LAPAROSCOPIC CHOLECYSTECTOMY;  Surgeon: Emily Colon;  Location: [facility];  Service: General;  Laterality: N/A;  . COLONOSCOPY N/A 04/30/2016   Procedure: COLONOSCOPY;  Surgeon: Emily Bali, MD;  Location: [facility];  Service: Endoscopy;  Laterality: N/A;  9:45am  . URETHRA SURGERY  age 70  . WISDOM TOOTH EXTRACTION  2001    There were no vitals filed for this visit.   Subjective Assessment - 04/12/19 1527    Subjective  [name] states that she has been having cervical pain that radiates down her Lt arm for years but she  had an acute exacerbation  right before Christmas where she almost could not move.  The pain is improving,  She currently  has the normal pain that goes into her Lt arm but not the excruciating pain that she was having.  She has been seeing a Emily Colon for years.  She had a MRI which shows spondylosis and bulging discs.    Pertinent History  fibromyalgia    Limitations  Reading;Lifting    How long can you sit comfortably?  30 minutes    Patient Stated Goals  less pain    Currently in Pain?  Yes    Pain Score  1    worst in the past week 4   Pain Location  Neck    Pain Orientation  Lower    Pain Descriptors / Indicators  Aching;Tightness    Pain Type  Chronic pain    Pain Radiating Towards  to hand; 2 x a week,    Pain Onset  More than a month ago    Pain Frequency  Intermittent    Aggravating Factors   lifting,    Pain Relieving Factors  medication, standing is better than sitting , Tens , ice    Effect of Pain on Daily Activities  just keeps going    Multiple Pain Sites  Yes    Pain Score  1    Pain Location  Back    Pain Orientation  Lower    Pain Descriptors / Indicators  Aching;Tightness    Pain Type  Chronic pain    Pain Onset  More than a month ago    Pain Frequency  Constant    Aggravating Factors   sitting    Pain Relieving Factors  shower    Effect of Pain on Daily Activities  limits at time         [facility] PT Assessment - 04/12/19 0001      Assessment   Medical Diagnosis  Cervical radiculopathy    Referring Provider (PT)  Emily Colon     Onset Date/Surgical Date  02/12/19    Hand Dominance  Right    Next MD Visit  after therapy     Prior Therapy  none      Precautions   Precautions  None      Restrictions   Weight Bearing Restrictions  No      Balance Screen   Has the patient fallen in the past 6 months  No    Has the patient had a decrease in activity level because of a fear of falling?   No    Is the patient reluctant to leave their home because of a fear of falling?   No      Prior Function   Level of Independence  Independent    Vocation  Full time employment    Vocation Requirements  sitting, pushing , pulling       Cognition   Overall Cognitive Status  Within Functional Limits for tasks assessed      Observation/Other Assessments   Focus on Therapeutic Outcomes (FOTO)   37%limited      Posture/Postural Control   Posture/Postural Control  Postural limitations    Postural  Limitations  Rounded Shoulders;Forward head;Increased thoracic kyphosis    Posture Comments  --   head is 6 cm in front of shoulder      ROM / Strength   AROM / PROM / Strength  AROM;Strength      AROM   AROM Assessment Site  Cervical    Cervical Flexion  --   wnl but feels pulling    Cervical Extension  --   wnl    Cervical - Right Side Bend  25    Cervical - Left Side Bend  35    Cervical - Right Rotation  55    Cervical - Left Rotation  60      Strength   Strength Assessment Site  Cervical    Cervical Extension  3+/5    Cervical - Right Side Bend  3+/5    Cervical - Left Side Bend  3+/5      Palpation   Palpation comment  moderate to marked mm spasms B upper trap.                 Objective measurements completed on examination: See above findings.      [facility] Adult PT Treatment/Exercise - 04/12/19 0001      Exercises   Exercises  Neck      Neck Exercises: Standing   Neck Retraction  10 reps    Other Standing Exercises  sitting tall, scapular retraction x 10              PT Education - 04/12/19 1619    Education Details  HEP, how pressure increases in your discs as you bend your head forward,    Person(s) Educated  Patient    Methods  Explanation;Handout;Verbal cues    Comprehension  Verbalized understanding;Returned demonstration       PT Short Term Goals - 04/12/19 1630      PT SHORT TERM GOAL #1   Title  Pt to be I in HEP to improve decrease her  pain to no greater than a 2/10 throughout the day.    Time  3    Period  Weeks    Status  New    Target Date  05/03/19      PT SHORT TERM GOAL #2   Title  PT to have no more than one headache a week    Time  3    Period  Weeks    Status  New      PT SHORT TERM GOAL #3   Title  PT pain to radiate no further than her elbow to demonstrate decreased nerve root irritation    Time  3    Status  New      PT SHORT TERM GOAL #4   Title  Pt to be able to rotate cervical spine at least 65  degrees in both directions for safer driving    Time  3    Period  Weeks        PT Long Term Goals - 04/12/19 1632      PT LONG TERM GOAL #1   Title  PT to be I in advance HEP to allow cervical strength to be at least 4+/5 to allow pt to lift dishes into higher cabinets without difficulty.    Time  6    Period  Weeks    Status  New    Target Date  05/24/19      PT LONG TERM GOAL #2   Title  PT to have had no headaches in the past two weeks.    Time  6    Period  Weeks    Status  New      PT LONG TERM GOAL #3   Title  PT to have had no radicular sx in the past week    Time  6    Period  Weeks             Plan - 04/12/19 1622    Clinical Impression Statement  Ms. Konicki is a 39 yo female who has had chronic cervical pain.  The pain was exacerbated in December without incident.  She was given mm relaxors, pain meds and antiinflammatory and she is doing much better, but continues to have tightness in her neck with periodic radiculopathy into her left hand.  She has been referred to skilled PT .  Evaluation demonstrates decreased awareness of body mechanics and correct posturing, decreased ROM, decreased strength, increased mm spasms and increased pain.  Ms. Chavers will benefit from skilled therapy to address these issues to improve her functional ability and decrease her pain.    Personal Factors and Comorbidities  Comorbidity 2    Comorbidities  fibromyalgia, lumbago    Examination-Activity Limitations  Carry;Sit;Sleep    Examination-Participation Restrictions  Cleaning;Yard Work    Stability/Clinical Decision Making  Stable/Uncomplicated    Optometrist  Low    Rehab Potential  Good    PT Frequency  2x / week    PT Duration  6 weeks    PT Treatment/Interventions  Dry needling;Passive range of motion;Manual techniques;Patient/family education;Therapeutic activities;Therapeutic exercise    PT Next Visit Plan  Begin cervical isometric as well as decompression 1-5  and manual    PT Home Exercise Plan  EVAL:  cervical extension, cervical and scapular retraction as well as tall sitting    Consulted and Agree with Plan of Care  Patient       Patient will benefit from skilled  therapeutic intervention in order to improve the following deficits and impairments:  Decreased range of motion, Decreased strength, Pain, Postural dysfunction  Visit Diagnosis: Radiculopathy, cervical region     Problem List Patient Active Problem List   Diagnosis Date Noted  . Elevated blood pressure reading 07/02/2016  . Elevated LFTs 04/15/2016  . Family history of colonic polyps 04/15/2016  . Rectal bleeding 04/15/2016  . POLYCYSTIC OVARIAN DISEASE 12/20/2006  . CARPAL TUNNEL SYNDROME 12/20/2006    Emily Colon, PT CLT [phone] 04/12/2019, 4:37 PM  [facility] [address] [place], [place], [address] Phone: [phone]   Fax:  [phone]  Name: Emily Colon MRN: [id number] Date of Birth: [birth year]

## 2019-04-17 ENCOUNTER — Ambulatory Visit (HOSPITAL_COMMUNITY): Payer: BC Managed Care – PPO | Admitting: Physical Therapy

## 2019-04-17 ENCOUNTER — Encounter (HOSPITAL_COMMUNITY): Payer: Self-pay | Admitting: Physical Therapy

## 2019-04-17 ENCOUNTER — Other Ambulatory Visit: Payer: Self-pay

## 2019-04-17 DIAGNOSIS — M542 Cervicalgia: Secondary | ICD-10-CM

## 2019-04-17 DIAGNOSIS — M5412 Radiculopathy, cervical region: Secondary | ICD-10-CM | POA: Diagnosis not present

## 2019-04-17 NOTE — Therapy (Signed)
[facility] [address] [place], [place], [address] Phone: [phone]   Fax:  [phone]  Physical Therapy Treatment  Patient Details  Name: Emily Colon MRN: [id number] Date of Birth: 1980/07/28 Referring Provider (PT): Romero Belling    Encounter Date: 04/17/2019  PT End of Session - 04/17/19 1310    Visit Number  2    Number of Visits  12    Date for PT Re-Evaluation  05/24/19    Authorization Type  bcbs    Authorization - Visit Number  2    Authorization - Number of Visits  12    PT Start Time  1050    PT Stop Time  1130    PT Time Calculation (min)  40 min    Activity Tolerance  Patient tolerated treatment well    Behavior During Therapy  [facility] for tasks assessed/performed       Past Medical History:  Diagnosis Date  . Hypertension   . Kidney stone   . PONV (postoperative nausea and vomiting)     Past Surgical History:  Procedure Laterality Date  . CARPAL TUNNEL RELEASE  2005 and 2006   bilateral    also cubital tunnel  . CHOLECYSTECTOMY  11/07/2010   Procedure: LAPAROSCOPIC CHOLECYSTECTOMY;  Surgeon: Fabio Bering;  Location: [facility];  Service: General;  Laterality: N/A;  . COLONOSCOPY N/A 04/30/2016   Procedure: COLONOSCOPY;  Surgeon: West Bali, MD;  Location: [facility];  Service: Endoscopy;  Laterality: N/A;  9:45am  . URETHRA SURGERY  age 39  . WISDOM TOOTH EXTRACTION  2001    There were no vitals filed for this visit.  Subjective Assessment - 04/17/19 1104    Subjective  Pt states that she completed the exercises over the weekend    Pertinent History  fibromyalgia    Limitations  Reading;Lifting    How long can you sit comfortably?  30 minutes    Patient Stated Goals  less pain    Currently in Pain?  Yes    Pain Score  1     Pain Location  Neck    Pain Orientation  Lower    Pain Descriptors / Indicators  Tender;Tightness    Pain Type  Chronic pain    Pain Onset  More than a month ago    Pain Frequency  Constant    Pain Onset  More than a month ago                       [facility] Adult PT Treatment/Exercise - 04/17/19 0001      Posture/Postural Control   Posture/Postural Control  Postural limitations    Postural Limitations  Rounded Shoulders;Forward head;Increased thoracic kyphosis    Posture Comments  --   head is 6 cm in front of shoulder      Exercises   Exercises  Neck      Neck Exercises: Standing   Neck Retraction  10 reps    Other Standing Exercises  sitting tall, scapular retraction x 10       Neck Exercises: Seated   Cervical Isometrics  Extension;Right lateral flexion;Left lateral flexion;3 secs;5 reps    Neck Retraction  10 reps      Neck Exercises: Supine   Shoulder Flexion  Both;10 reps    Other Supine Exercise  decompression exercises 1-5       Manual Therapy   Manual Therapy  Soft tissue  mobilization;Passive ROM;Manual Traction    Manual therapy comments  done seperate from all other aspects of treatment    Soft tissue mobilization  to decrease mm spasm     Passive ROM  to improve flexibiltiy and ROM    Manual Traction  to decrease pain              PT Education - 04/17/19 1313    Education Details  HEP    Person(s) Educated  Patient    Methods  Explanation;Verbal cues;Handout    Comprehension  Returned demonstration       PT Short Term Goals - 04/17/19 1045      PT SHORT TERM GOAL #1   Title  Pt to be I in HEP to improve decrease her pain to no greater than a 2/10 throughout the day.    Time  3    Period  Weeks    Status  On-going    Target Date  05/03/19      PT SHORT TERM GOAL #2   Title  PT to have no more than one headache a week    Time  3    Period  Weeks    Status  On-going      PT SHORT TERM GOAL #3   Title  PT pain to radiate no further than her elbow to demonstrate decreased nerve root irritation    Time  3    Status  On-going      PT SHORT TERM GOAL #4   Title  Pt to be able to rotate  cervical spine at least 65 degrees in both directions for safer driving    Time  3    Period  Weeks    Status  On-going        PT Long Term Goals - 04/17/19 1046      PT LONG TERM GOAL #1   Title  PT to be I in advance HEP to allow cervical strength to be at least 4+/5 to allow pt to lift dishes into higher cabinets without difficulty.    Time  6    Period  Weeks    Status  On-going      PT LONG TERM GOAL #2   Title  PT to have had no headaches in the past two weeks.    Time  6    Period  Weeks    Status  On-going      PT LONG TERM GOAL #3   Title  PT to have had no radicular sx in the past week    Time  6    Period  Weeks    Status  On-going            Plan - 04/17/19 1311    Clinical Impression Statement  PT and therapist reviewed evaluation and goals.  Therapist instructed pt in decompression exercises as well as supine cervical stability with UE flexion.  Manual started to decrease pain and improve ROM    Personal Factors and Comorbidities  Comorbidity 2    Comorbidities  fibromyalgia, lumbago    Examination-Activity Limitations  Carry;Sit;Sleep    Examination-Participation Restrictions  Cleaning;Yard Work    Stability/Clinical Decision Making  Stable/Uncomplicated    Rehab Potential  Good    PT Frequency  2x / week    PT Duration  6 weeks    PT Treatment/Interventions  Dry needling;Passive range of motion;Manual techniques;Patient/family education;Therapeutic activities;Therapeutic exercise    PT Next Visit  Plan  postural and decompressive t band exercises    PT Home Exercise Plan  EVAL:  cervical extension, cervical and scapular retraction as well as tall sitting; 2/22: cervical  isometric and supine decompressive exercises    Consulted and Agree with Plan of Care  Patient       Patient will benefit from skilled therapeutic intervention in order to improve the following deficits and impairments:  Decreased range of motion, Decreased strength, Pain, Postural  dysfunction  Visit Diagnosis: Cervicalgia     Problem List Patient Active Problem List   Diagnosis Date Noted  . Elevated blood pressure reading 07/02/2016  . Elevated LFTs 04/15/2016  . Family history of colonic polyps 04/15/2016  . Rectal bleeding 04/15/2016  . POLYCYSTIC OVARIAN DISEASE 12/20/2006  . CARPAL TUNNEL SYNDROME 12/20/2006  Virgina Organ, PT CLT [phone] 04/17/2019, 1:16 PM  [facility] [address] [place], [place], [address] Phone: [phone]   Fax:  [phone]  Name: Emily Colon MRN: [id number] Date of Birth: September 04, 1980

## 2019-04-19 ENCOUNTER — Ambulatory Visit (HOSPITAL_COMMUNITY): Payer: BC Managed Care – PPO

## 2019-04-19 ENCOUNTER — Other Ambulatory Visit: Payer: Self-pay

## 2019-04-19 ENCOUNTER — Encounter (HOSPITAL_COMMUNITY): Payer: Self-pay

## 2019-04-19 DIAGNOSIS — M542 Cervicalgia: Secondary | ICD-10-CM

## 2019-04-19 DIAGNOSIS — M5412 Radiculopathy, cervical region: Secondary | ICD-10-CM

## 2019-04-19 NOTE — Therapy (Signed)
[facility] [address] [place], [place], [address] Phone: [phone]   Fax:  [phone]  Physical Therapy Treatment  Patient Details  Name: Emily Colon MRN: [id number] Date of Birth: 07-11-1980 Referring Provider (PT): Romero Belling    Encounter Date: 04/18/2036  PT End of Session - 04/19/19 1408    Visit Number  3    Number of Visits  12    Date for PT Re-Evaluation  05/24/19    Authorization Type  bcbs    Authorization Time Period  Cert: 9/47-->0/96/28    Authorization - Visit Number  3    Authorization - Number of Visits  12    Progress Note Due on Visit  10    PT Start Time  1401    PT Stop Time  1440    PT Time Calculation (min)  39 min    Activity Tolerance  Patient tolerated treatment well    Behavior During Therapy  [facility] for tasks assessed/performed       Past Medical History:  Diagnosis Date  . Hypertension   . Kidney stone   . PONV (postoperative nausea and vomiting)     Past Surgical History:  Procedure Laterality Date  . CARPAL TUNNEL RELEASE  2005 and 2006   bilateral    also cubital tunnel  . CHOLECYSTECTOMY  11/07/2010   Procedure: LAPAROSCOPIC CHOLECYSTECTOMY;  Surgeon: Fabio Bering;  Location: [facility];  Service: General;  Laterality: N/A;  . COLONOSCOPY N/A 04/30/2016   Procedure: COLONOSCOPY;  Surgeon: West Bali, MD;  [facility];  Service: Endoscopy;  Laterality: N/A;  9:45am  . URETHRA SURGERY  age 4  . WISDOM TOOTH EXTRACTION  2001    There were no vitals filed for this visit.  Subjective Assessment - 04/19/19 1404    Subjective  Pt stated she is feeling good today, reports compliance with current HEP.  No reports of neck pain or radicular symptoms, does feel she might have pulled a lower back muscle on Rt side.    Pertinent History  fibromyalgia    Patient Stated Goals  less pain    Currently in Pain?  No/denies                       [facility] Adult PT  Treatment/Exercise - 04/19/19 0001      Posture/Postural Control   Posture/Postural Control  Postural limitations    Postural Limitations  Rounded Shoulders;Forward head;Increased thoracic kyphosis      Exercises   Exercises  Neck      Neck Exercises: Theraband   Shoulder Extension  10 reps;Red    Shoulder Extension Limitations  2 sets    Rows  10 reps;Red    Rows Limitations  2 sets      Neck Exercises: Standing   Neck Retraction  10 reps    Neck Retraction Limitations  at wall      Neck Exercises: Seated   Neck Retraction  10 reps;3 secs    W Back  10 reps    Other Seated Exercise  scapular retraction      Neck Exercises: Supine   Neck Retraction  10 reps;3 secs    Other Supine Exercise  decompression RTB 10x      Manual Therapy   Manual Therapy  Soft tissue mobilization;Passive ROM;Manual Traction    Manual therapy comments  done seperate from all other aspects of treatment  Soft tissue mobilization  to decrease mm spasm     Manual Traction  to decrease pain                PT Short Term Goals - 04/17/19 1045      PT SHORT TERM GOAL #1   Title  Pt to be I in HEP to improve decrease her pain to no greater than a 2/10 throughout the day.    Time  3    Period  Weeks    Status  On-going    Target Date  05/03/19      PT SHORT TERM GOAL #2   Title  PT to have no more than one headache a week    Time  3    Period  Weeks    Status  On-going      PT SHORT TERM GOAL #3   Title  PT pain to radiate no further than her elbow to demonstrate decreased nerve root irritation    Time  3    Status  On-going      PT SHORT TERM GOAL #4   Title  Pt to be able to rotate cervical spine at least 65 degrees in both directions for safer driving    Time  3    Period  Weeks    Status  On-going        PT Long Term Goals - 04/17/19 1046      PT LONG TERM GOAL #1   Title  PT to be I in advance HEP to allow cervical strength to be at least 4+/5 to allow pt to lift  dishes into higher cabinets without difficulty.    Time  6    Period  Weeks    Status  On-going      PT LONG TERM GOAL #2   Title  PT to have had no headaches in the past two weeks.    Time  6    Period  Weeks    Status  On-going      PT LONG TERM GOAL #3   Title  PT to have had no radicular sx in the past week    Time  6    Period  Weeks    Status  On-going            Plan - 04/19/19 1443    Clinical Impression Statement  Educated importance of posture for pain and radicular symptom reduction.  Progressed to theraband with decompression and postural strengthening, min cueing for form initially then able to demonstrate correct mechanics.  EOS with manual to address tightness cervical musculature for pain and mobility.    Personal Factors and Comorbidities  Comorbidity 2    Comorbidities  fibromyalgia, lumbago    Examination-Activity Limitations  Carry;Sit;Sleep    Examination-Participation Restrictions  Cleaning;Yard Work    Stability/Clinical Decision Making  Stable/Uncomplicated    Optometrist  Low    Rehab Potential  Good    PT Frequency  2x / week    PT Duration  6 weeks    PT Treatment/Interventions  Dry needling;Passive range of motion;Manual techniques;Patient/family education;Therapeutic activities;Therapeutic exercise    PT Next Visit Plan  Add UT stretch next session.  Progress postural strengthening with theraband, progress to prone postural strengthening as able (chin tucks, rows, shoulder extension).    PT Home Exercise Plan  EVAL:  cervical extension, cervical and scapular retraction as well as tall sitting; 2/22: cervical  isometric  and supine decompressive exercises 2/24: RTB with decompression       Patient will benefit from skilled therapeutic intervention in order to improve the following deficits and impairments:  Decreased range of motion, Decreased strength, Pain, Postural dysfunction  Visit Diagnosis: Radiculopathy, cervical  region  Cervicalgia     Problem List Patient Active Problem List   Diagnosis Date Noted  . Elevated blood pressure reading 07/02/2016  . Elevated LFTs 04/15/2016  . Family history of colonic polyps 04/15/2016  . Rectal bleeding 04/15/2016  . POLYCYSTIC OVARIAN DISEASE 12/20/2006  . CARPAL TUNNEL SYNDROME 12/20/2006   Ihor Austin, LPTA/CLT; CBIS [phone]  Aldona Lento 04/19/2019, 2:47 PM  [facility] [address] [place], [place], [address] Phone: [phone]   Fax:  [phone]  Name: Emily Colon MRN: [id number] Date of Birth: 1980-08-29

## 2019-04-24 ENCOUNTER — Other Ambulatory Visit: Payer: Self-pay

## 2019-04-24 ENCOUNTER — Encounter (HOSPITAL_COMMUNITY): Payer: Self-pay | Admitting: Physical Therapy

## 2019-04-24 ENCOUNTER — Ambulatory Visit (HOSPITAL_COMMUNITY): Payer: BC Managed Care – PPO | Attending: Physical Medicine and Rehabilitation | Admitting: Physical Therapy

## 2019-04-24 DIAGNOSIS — M5412 Radiculopathy, cervical region: Secondary | ICD-10-CM | POA: Diagnosis present

## 2019-04-24 DIAGNOSIS — M542 Cervicalgia: Secondary | ICD-10-CM

## 2019-04-24 NOTE — Therapy (Signed)
[place], [place], [address] Phone: [phone]   Fax:  [phone]  Physical Therapy Treatment  Patient Details  Name: Emily Colon MRN: [id number] Date of Birth: Apr 29, 1980 Referring Provider (PT): Laroy Apple    Encounter Date: 04/24/2019  PT End of Session - 04/24/19 1314    Visit Number  4    Number of Visits  12    Date for PT Re-Evaluation  05/24/19    Authorization Type  bcbs    Authorization Time Period  Cert: 2/48-->2/50/03    Authorization - Visit Number  4    Authorization - Number of Visits  12    Progress Note Due on Visit  10    PT Start Time  7048    PT Stop Time  1555    PT Time Calculation (min)  39 min    Activity Tolerance  Patient tolerated treatment well    Behavior During Therapy  [facility] for tasks assessed/performed       Past Medical History:  Diagnosis Date  . Hypertension   . Kidney stone   . PONV (postoperative nausea and vomiting)     Past Surgical History:  Procedure Laterality Date  . CARPAL TUNNEL RELEASE  2005 and 2006   bilateral    also cubital tunnel  . CHOLECYSTECTOMY  11/07/2010   Procedure: LAPAROSCOPIC CHOLECYSTECTOMY;  Surgeon: Donato Heinz;  Location: [facility];  Service: General;  Laterality: N/A;  . COLONOSCOPY N/A 04/30/2016   Procedure: COLONOSCOPY;  Surgeon: Danie Binder, MD;  Location: [facility];  Service: Endoscopy;  Laterality: N/A;  9:45am  . URETHRA SURGERY  age 39  . WISDOM TOOTH EXTRACTION  2001    There were no vitals filed for this visit.  Subjective Assessment - 04/24/19 1329    Subjective  Pt states that she has been doing her HEP her mm are sore but not pain.    Pertinent History  fibromyalgia    Limitations  Reading;Lifting    How long can you sit comfortably?  30 minutes    Patient Stated Goals  less pain    Currently in Pain?  No/denies    Pain Onset  More than a month ago    Pain Onset  More than a month ago                        [facility] Adult PT Treatment/Exercise - 04/24/19 0001      Posture/Postural Control   Posture/Postural Control  Postural limitations    Postural Limitations  Rounded Shoulders;Forward head;Increased thoracic kyphosis      Exercises   Exercises  Neck      Neck Exercises: Theraband   Scapula Retraction  10 reps;Green    Shoulder Extension  10 reps;Green    Shoulder Extension Limitations  --    Rows  10 reps;Green    Rows Limitations  --      Neck Exercises: Standing   Neck Retraction  --    Neck Retraction Limitations  --      Neck Exercises: Seated   Neck Retraction  10 reps;3 secs    W Back  10 reps    W Back Weights (lbs)  2 lb     Other Seated Exercise  --      Neck Exercises: Supine   Neck Retraction  10 reps;3 secs    Other Supine Exercise  --  Neck Exercises: Prone   Shoulder Extension  10 reps    Shoulder Extension Weights (lbs)  2    Rows  10 reps    Rows Weights (lbs)  2      Manual Therapy   Manual Therapy  Soft tissue mobilization;Passive ROM;Manual Traction    Manual therapy comments  done seperate from all other aspects of treatment    Soft tissue mobilization  to decrease mm spasm     Manual Traction  to decrease pain       Neck Exercises: Stretches   Upper Trapezius Stretch  Right;Left;3 reps;20 seconds             PT Education - 04/24/19 1400    Education Details  HEP    Person(s) Educated  Patient    Methods  Explanation;Handout    Comprehension  Verbalized understanding       PT Short Term Goals - 04/24/19 1400      PT SHORT TERM GOAL #1   Title  Pt to be I in HEP to improve decrease her pain to no greater than a 2/10 throughout the day.    Time  3    Period  Weeks    Status  Achieved    Target Date  05/03/19      PT SHORT TERM GOAL #2   Title  PT to have no more than one headache a week    Time  3    Period  Weeks    Status  Achieved      PT SHORT TERM GOAL #3   Title  PT pain to  radiate no further than her elbow to demonstrate decreased nerve root irritation    Time  3    Status  On-going      PT SHORT TERM GOAL #4   Title  Pt to be able to rotate cervical spine at least 65 degrees in both directions for safer driving    Time  3    Period  Weeks    Status  On-going        PT Long Term Goals - 04/24/19 1401      PT LONG TERM GOAL #1   Title  PT to be I in advance HEP to allow cervical strength to be at least 4+/5 to allow pt to lift dishes into higher cabinets without difficulty.    Time  6    Period  Weeks    Status  On-going      PT LONG TERM GOAL #2   Title  PT to have had no headaches in the past two weeks.    Time  6    Period  Weeks    Status  On-going      PT LONG TERM GOAL #3   Title  PT to have had no radicular sx in the past week    Time  6    Period  Weeks    Status  Partially Met            Plan - 04/24/19 1358    Clinical Impression Statement  Pt request review of Tband postural exercises, pt completing well.  Educated in new exercises per flow sheet with HEP given.    Personal Factors and Comorbidities  Comorbidity 2    Comorbidities  fibromyalgia, lumbago    Examination-Activity Limitations  Carry;Sit;Sleep    Examination-Participation Restrictions  Cleaning;Yard Work    Stability/Clinical Decision Making  Stable/Uncomplicated  Rehab Potential  Good    PT Frequency  2x / week    PT Duration  6 weeks    PT Treatment/Interventions  Dry needling;Passive range of motion;Manual techniques;Patient/family education;Therapeutic activities;Therapeutic exercise    PT Next Visit Plan  Add UT stretch next session.  Progress postural strengthening with theraband, progress to prone postural strengthening as able (chin tucks, rows, shoulder extension).    PT Home Exercise Plan  EVAL:  cervical extension, cervical and scapular retraction as well as tall sitting; 2/22: cervical  isometric and supine decompressive exercises 2/24: RTB with  decompression3/1 :  upper trap stretch, prone rows and hip extension.       Patient will benefit from skilled therapeutic intervention in order to improve the following deficits and impairments:  Decreased range of motion, Decreased strength, Pain, Postural dysfunction  Visit Diagnosis: Radiculopathy, cervical region  Cervicalgia     Problem List Patient Active Problem List   Diagnosis Date Noted  . Elevated blood pressure reading 07/02/2016  . Elevated LFTs 04/15/2016  . Family history of colonic polyps 04/15/2016  . Rectal bleeding 04/15/2016  . POLYCYSTIC OVARIAN DISEASE 12/20/2006  . CARPAL TUNNEL SYNDROME 12/20/2006   Rayetta Humphrey, PT CLT [phone] 04/24/2019, 2:01 PM  [place] [address] [place], [place], [address] Phone: [phone]   Fax:  [phone]  Name: Emily Colon MRN: [id number] Date of Birth: Jan 23, 1981

## 2019-04-26 ENCOUNTER — Encounter (HOSPITAL_COMMUNITY): Payer: Self-pay

## 2019-04-26 ENCOUNTER — Other Ambulatory Visit: Payer: Self-pay

## 2019-04-26 ENCOUNTER — Ambulatory Visit (HOSPITAL_COMMUNITY): Payer: BC Managed Care – PPO

## 2019-04-26 DIAGNOSIS — M5412 Radiculopathy, cervical region: Secondary | ICD-10-CM | POA: Diagnosis not present

## 2019-04-26 DIAGNOSIS — M542 Cervicalgia: Secondary | ICD-10-CM

## 2019-04-26 NOTE — Therapy (Signed)
[place], [place], [address] Phone: [phone]   Fax:  [phone]  Physical Therapy Treatment  Patient Details  Name: Emily Colon MRN: [id number] Date of Birth: April 25, 1980 Referring Provider (PT): Laroy Apple    Encounter Date: 04/26/2019  PT End of Session - 04/26/19 1134    Visit Number  5    Number of Visits  12    Date for PT Re-Evaluation  05/24/19    Authorization Type  bcbs    Authorization Time Period  Cert: 6/16-->0/73/71    Authorization - Visit Number  5    Authorization - Number of Visits  12    Progress Note Due on Visit  10    PT Start Time  1128    PT Stop Time  1213    PT Time Calculation (min)  45 min    Activity Tolerance  Patient tolerated treatment well    Behavior During Therapy  [facility] for tasks assessed/performed       Past Medical History:  Diagnosis Date  . Hypertension   . Kidney stone   . PONV (postoperative nausea and vomiting)     Past Surgical History:  Procedure Laterality Date  . CARPAL TUNNEL RELEASE  2005 and 2006   bilateral    also cubital tunnel  . CHOLECYSTECTOMY  11/07/2010   Procedure: LAPAROSCOPIC CHOLECYSTECTOMY;  Surgeon: Donato Heinz;  Location: [facility];  Service: General;  Laterality: N/A;  . COLONOSCOPY N/A 04/30/2016   Procedure: COLONOSCOPY;  Surgeon: Danie Binder, MD;  Location[facility];  Service: Endoscopy;  Laterality: N/A;  9:45am  . URETHRA SURGERY  age 40  . WISDOM TOOTH EXTRACTION  2001    There were no vitals filed for this visit.  Subjective Assessment - 04/26/19 1130    Subjective  Pt stated she has some intermittent burning on Lt side of neck.  Reports improved awareness of posture and tries to correct when notices tenderness.    Pertinent History  fibromyalgia    Patient Stated Goals  less pain    Currently in Pain?  No/denies    Pain Score  1     Pain Location  Neck    Pain Orientation  Left    Pain Descriptors /  Indicators  Tightness;Tender    Pain Type  Chronic pain    Pain Radiating Towards  Reports no radicular symptoms in a month    Pain Onset  More than a month ago    Pain Frequency  Constant    Aggravating Factors   lifting    Pain Relieving Factors  medication, standing is better than sitting, TENS, ice    Effect of Pain on Daily Activities  just keeps going         [facility] PT Assessment - 04/26/19 0001      Assessment   Medical Diagnosis  Cervical radiculopathy    Referring Provider (PT)  Laroy Apple     Onset Date/Surgical Date  02/12/19    Hand Dominance  Right    Next MD Visit  April after therapy    Prior Therapy  none      Precautions   Precautions  None                   [facility] Adult PT Treatment/Exercise - 04/26/19 0001      Posture/Postural Control   Posture/Postural Control  Postural limitations    Postural  Limitations  Rounded Shoulders;Forward head;Increased thoracic kyphosis      Exercises   Exercises  Neck      Neck Exercises: Theraband   Scapula Retraction  15 reps;Green    Scapula Retraction Limitations  HEP    Shoulder Extension  15 reps;Green    Shoulder Extension Limitations  HEP    Rows  15 reps;Green    Rows Limitations  HEP      Neck Exercises: Standing   Neck Retraction  10 reps    Neck Retraction Limitations  at wall    Wall Push Ups  10 reps      Neck Exercises: Prone   Neck Retraction  10 reps;3 secs    Neck Retraction Limitations  chin tuck head lift    Shoulder Extension  10 reps    Shoulder Extension Weights (lbs)  2    Rows  10 reps    Rows Weights (lbs)  2      Manual Therapy   Manual Therapy  Soft tissue mobilization;Passive ROM;Manual Traction    Manual therapy comments  done seperate from all other aspects of treatment    Soft tissue mobilization  to decrease mm spasm     Manual Traction  to decrease pain       Neck Exercises: Stretches   Upper Trapezius Stretch  Right;Left;3 reps;20 seconds                PT Short Term Goals - 04/24/19 1400      PT SHORT TERM GOAL #1   Title  Pt to be I in HEP to improve decrease her pain to no greater than a 2/10 throughout the day.    Time  3    Period  Weeks    Status  Achieved    Target Date  05/03/19      PT SHORT TERM GOAL #2   Title  PT to have no more than one headache a week    Time  3    Period  Weeks    Status  Achieved      PT SHORT TERM GOAL #3   Title  PT pain to radiate no further than her elbow to demonstrate decreased nerve root irritation    Time  3    Status  On-going      PT SHORT TERM GOAL #4   Title  Pt to be able to rotate cervical spine at least 65 degrees in both directions for safer driving    Time  3    Period  Weeks    Status  On-going        PT Long Term Goals - 04/24/19 1401      PT LONG TERM GOAL #1   Title  PT to be I in advance HEP to allow cervical strength to be at least 4+/5 to allow pt to lift dishes into higher cabinets without difficulty.    Time  6    Period  Weeks    Status  On-going      PT LONG TERM GOAL #2   Title  PT to have had no headaches in the past two weeks.    Time  6    Period  Weeks    Status  On-going      PT LONG TERM GOAL #3   Title  PT to have had no radicular sx in the past week    Time  6    Period  Weeks    Status  Partially Met            Plan - 04/26/19 1218    Clinical Impression Statement  Progressed postural strengthening with additional prone chin tuck/head lift and wall push-ups.  Reviewed form with theraband and given printout for UT stretch to add to HEP.  EOS with manual to address tight upper traps wiht reports of relief following.    Personal Factors and Comorbidities  Comorbidity 2    Comorbidities  fibromyalgia, lumbago    Examination-Activity Limitations  Carry;Sit;Sleep    Examination-Participation Restrictions  Cleaning;Yard Work    Stability/Clinical Decision Making  Stable/Uncomplicated    Designer, jewellery  Low     Rehab Potential  Good    PT Frequency  2x / week    PT Duration  6 weeks    PT Treatment/Interventions  Dry needling;Passive range of motion;Manual techniques;Patient/family education;Therapeutic activities;Therapeutic exercise    PT Next Visit Plan  Add UBE.  Progress postural strengthening with standing and prone postural strengthening as able (chin tucks, rows, shoulder extension).    PT Home Exercise Plan  EVAL:  cervical extension, cervical and scapular retraction as well as tall sitting; 2/22: cervical  isometric and supine decompressive exercises 2/24: RTB with decompression3/1 :  upper trap stretch, prone rows and hip extension.       Patient will benefit from skilled therapeutic intervention in order to improve the following deficits and impairments:  Decreased range of motion, Decreased strength, Pain, Postural dysfunction  Visit Diagnosis: Radiculopathy, cervical region  Cervicalgia     Problem List Patient Active Problem List   Diagnosis Date Noted  . Elevated blood pressure reading 07/02/2016  . Elevated LFTs 04/15/2016  . Family history of colonic polyps 04/15/2016  . Rectal bleeding 04/15/2016  . POLYCYSTIC OVARIAN DISEASE 12/20/2006  . CARPAL TUNNEL SYNDROME 12/20/2006   Ihor Austin, LPTA/CLT; CBIS [phone]  Aldona Lento 04/26/2019, 12:23 PM  [place] [address] [place], [place], [address] Phone: [phone]   Fax:  [phone]  Name: Emily Colon MRN: [id number] Date of Birth: 02/04/1981

## 2019-04-26 NOTE — Patient Instructions (Signed)
Flexibility: Upper Trapezius Stretch    Gently grasp right side of head while reaching behind back with other hand. Tilt head away until a gentle stretch is felt. Hold 30 seconds. Repeat 3 times per set. Do 2 sets per day.  http://orth.exer.us/341   Copyright  VHI. All rights reserved.   

## 2019-04-28 ENCOUNTER — Ambulatory Visit: Payer: BC Managed Care – PPO | Attending: Internal Medicine

## 2019-04-28 DIAGNOSIS — Z23 Encounter for immunization: Secondary | ICD-10-CM | POA: Insufficient documentation

## 2019-04-28 NOTE — Progress Notes (Signed)
   Covid-19 Vaccination Clinic  Name:  Emily Colon    MRN: [id number] DOB: 1980/04/03  04/28/2019  Emily Colon was observed post Covid-19 immunization for 15 minutes without incident. She was provided with Vaccine Information Sheet and instruction to access the V-Safe system.   Emily Colon was instructed to call 911 with any severe reactions post vaccine: Marland Kitchen Difficulty breathing  . Swelling of face and throat  . A fast heartbeat  . A bad rash all over body  . Dizziness and weakness   Immunizations Administered    Name Date Dose VIS Date Route   Moderna COVID-19 Vaccine 04/28/2019  8:39 AM 0.5 mL 01/24/2019 Intramuscular   Manufacturer: Moderna   Lot: [id number]   NDC: 41991-444-58

## 2019-05-01 ENCOUNTER — Ambulatory Visit (HOSPITAL_COMMUNITY): Payer: BC Managed Care – PPO | Admitting: Physical Therapy

## 2019-05-02 ENCOUNTER — Ambulatory Visit (HOSPITAL_COMMUNITY): Payer: BC Managed Care – PPO | Admitting: Physical Therapy

## 2019-05-03 ENCOUNTER — Ambulatory Visit (HOSPITAL_COMMUNITY): Payer: BC Managed Care – PPO | Admitting: Physical Therapy

## 2019-05-03 ENCOUNTER — Encounter (HOSPITAL_COMMUNITY): Payer: Self-pay | Admitting: Physical Therapy

## 2019-05-03 ENCOUNTER — Other Ambulatory Visit: Payer: Self-pay

## 2019-05-03 DIAGNOSIS — M5412 Radiculopathy, cervical region: Secondary | ICD-10-CM

## 2019-05-03 DIAGNOSIS — M542 Cervicalgia: Secondary | ICD-10-CM

## 2019-05-03 NOTE — Therapy (Signed)
[place], [place], [address] Phone: [phone]   Fax:  [phone]  Physical Therapy Treatment  Patient Details  Name: Emily Colon MRN: [id number] Date of Birth: 05-06-1980 Referring Provider (PT): Laroy Apple    Encounter Date: 05/03/2019  PT End of Session - 05/03/19 1345    Visit Number  6    Number of Visits  12    Date for PT Re-Evaluation  05/24/19    Authorization Type  bcbs    Authorization Time Period  Cert: 6/30-->1/60/10    Authorization - Visit Number  6    Authorization - Number of Visits  12    Progress Note Due on Visit  10    PT Start Time  1138    PT Stop Time  1216    PT Time Calculation (min)  38 min    Activity Tolerance  Patient tolerated treatment well    Behavior During Therapy  [facility] for tasks assessed/performed       Past Medical History:  Diagnosis Date  . Hypertension   . Kidney stone   . PONV (postoperative nausea and vomiting)     Past Surgical History:  Procedure Laterality Date  . CARPAL TUNNEL RELEASE  2005 and 2006   bilateral    also cubital tunnel  . CHOLECYSTECTOMY  11/07/2010   Procedure: LAPAROSCOPIC CHOLECYSTECTOMY;  Surgeon: Donato Heinz;  Location: [facility];  Service: General;  Laterality: N/A;  . COLONOSCOPY N/A 04/30/2016   Procedure: COLONOSCOPY;  Surgeon: Danie Binder, MD;  Location: [facility];  Service: Endoscopy;  Laterality: N/A;  9:45am  . URETHRA SURGERY  age 31  . WISDOM TOOTH EXTRACTION  2001    There were no vitals filed for this visit.  Subjective Assessment - 05/03/19 1340    Subjective  PT states that her son had surgery on his foot so she has not been able to complete her exercises, thus she is having more neck pain today.    Pertinent History  fibromyalgia    Patient Stated Goals  less pain    Currently in Pain?  Yes    Pain Score  4     Pain Location  Neck    Pain Orientation  Lower    Pain Descriptors / Indicators  Aching     Pain Type  Chronic pain    Pain Onset  More than a month ago    Pain Frequency  Constant    Pain Relieving Factors  exercises    Effect of Pain on Daily Activities  just keeps going                       [facility] Adult PT Treatment/Exercise - 05/03/19 0001      Exercises   Exercises  Neck      Neck Exercises: Supine   Cervical Isometrics  Extension;Right lateral flexion;Left lateral flexion;3 secs;5 reps    Neck Retraction  10 reps;3 secs    Neck Retraction Limitations  scapular retraction x 10     Cervical Rotation  Both;5 reps      Manual Therapy   Manual Therapy  Joint mobilization;Soft tissue mobilization;Passive ROM;Manual Traction    Manual therapy comments  done seperate from all other aspects of treatment    Joint Mobilization  to improve movement    Soft tissue mobilization  to decrease pain and mm spasm     Passive  ROM  to improve ROM    Manual Traction  to decrease pain                PT Short Term Goals - 04/24/19 1400      PT SHORT TERM GOAL #1   Title  Pt to be I in HEP to improve decrease her pain to no greater than a 2/10 throughout the day.    Time  3    Period  Weeks    Status  Achieved    Target Date  05/03/19      PT SHORT TERM GOAL #2   Title  PT to have no more than one headache a week    Time  3    Period  Weeks    Status  Achieved      PT SHORT TERM GOAL #3   Title  PT pain to radiate no further than her elbow to demonstrate decreased nerve root irritation    Time  3    Status  On-going      PT SHORT TERM GOAL #4   Title  Pt to be able to rotate cervical spine at least 65 degrees in both directions for safer driving    Time  3    Period  Weeks    Status  On-going        PT Long Term Goals - 04/24/19 1401      PT LONG TERM GOAL #1   Title  PT to be I in advance HEP to allow cervical strength to be at least 4+/5 to allow pt to lift dishes into higher cabinets without difficulty.    Time  6    Period  Weeks     Status  On-going      PT LONG TERM GOAL #2   Title  PT to have had no headaches in the past two weeks.    Time  6    Period  Weeks    Status  On-going      PT LONG TERM GOAL #3   Title  PT to have had no radicular sx in the past week    Time  6    Period  Weeks    Status  Partially Met            Plan - 05/03/19 1345    Clinical Impression Statement  PT with increased pain today therefore treatment focused on reduction of pain as well as strengthening postural mm.  PT pain down to a .5 following manual .    Personal Factors and Comorbidities  Comorbidity 2    Comorbidities  fibromyalgia, lumbago    Examination-Activity Limitations  Carry;Sit;Sleep    Examination-Participation Restrictions  Cleaning;Yard Work    Stability/Clinical Decision Making  Stable/Uncomplicated    Rehab Potential  Good    PT Frequency  2x / week    PT Duration  6 weeks    PT Treatment/Interventions  Dry needling;Passive range of motion;Manual techniques;Patient/family education;Therapeutic activities;Therapeutic exercise    PT Next Visit Plan  Add UBE.  Progress postural strengthening with standing and prone postural strengthening as able (chin tucks, rows, shoulder extension).    PT Home Exercise Plan  EVAL:  cervical extension, cervical and scapular retraction as well as tall sitting; 2/22: cervical  isometric and supine decompressive exercises 2/24: RTB with decompression3/1 :  upper trap stretch, prone rows and hip extension.       Patient will benefit from skilled therapeutic  intervention in order to improve the following deficits and impairments:  Decreased range of motion, Decreased strength, Pain, Postural dysfunction  Visit Diagnosis: Radiculopathy, cervical region  Cervicalgia     Problem List Patient Active Problem List   Diagnosis Date Noted  . Elevated blood pressure reading 07/02/2016  . Elevated LFTs 04/15/2016  . Family history of colonic polyps 04/15/2016  . Rectal  bleeding 04/15/2016  . POLYCYSTIC OVARIAN DISEASE 12/20/2006  . CARPAL TUNNEL SYNDROME 12/20/2006  Rayetta Humphrey, PT CLT [phone] 05/03/2019, 1:48 PM  [place] [address] [place], [place], [address] Phone: [phone]   Fax:  [phone]  Name: ELISABETTA MISHRA MRN: [id number] Date of Birth: 1981/02/06

## 2019-05-05 ENCOUNTER — Encounter (HOSPITAL_COMMUNITY): Payer: Self-pay

## 2019-05-05 ENCOUNTER — Other Ambulatory Visit: Payer: Self-pay

## 2019-05-05 ENCOUNTER — Ambulatory Visit (HOSPITAL_COMMUNITY): Payer: BC Managed Care – PPO

## 2019-05-05 DIAGNOSIS — M5412 Radiculopathy, cervical region: Secondary | ICD-10-CM

## 2019-05-05 DIAGNOSIS — M542 Cervicalgia: Secondary | ICD-10-CM

## 2019-05-05 NOTE — Therapy (Signed)
[place], [place], [address] Phone: [phone]   Fax:  [phone]  Physical Therapy Treatment  Patient Details  Name: Emily Colon MRN: [id number] Date of Birth: 1980-11-23 Referring Provider (PT): Laroy Apple    Encounter Date: 05/05/2019  PT End of Session - 05/05/19 1356    Visit Number  7    Number of Visits  12    Date for PT Re-Evaluation  05/24/19    Authorization Type  bcbs    Authorization Time Period  Cert: 0/30-->0/92/33    Authorization - Visit Number  7    Authorization - Number of Visits  12    Progress Note Due on Visit  10    PT Start Time  0076    PT Stop Time  1435    PT Time Calculation (min)  42 min    Activity Tolerance  Patient tolerated treatment well    Behavior During Therapy  [facility] for tasks assessed/performed       Past Medical History:  Diagnosis Date  . Hypertension   . Kidney stone   . PONV (postoperative nausea and vomiting)     Past Surgical History:  Procedure Laterality Date  . CARPAL TUNNEL RELEASE  2005 and 2006   bilateral    also cubital tunnel  . CHOLECYSTECTOMY  11/07/2010   Procedure: LAPAROSCOPIC CHOLECYSTECTOMY;  Surgeon: Donato Heinz;  Location: [facility];  Service: General;  Laterality: N/A;  . COLONOSCOPY N/A 04/30/2016   Procedure: COLONOSCOPY;  Surgeon: Danie Binder, MD;  Location: [facility];  Service: Endoscopy;  Laterality: N/A;  9:45am  . URETHRA SURGERY  age 39  . WISDOM TOOTH EXTRACTION  2001    There were no vitals filed for this visit.  Subjective Assessment - 05/05/19 1351    Subjective  Pt stated she a "twing" on Lt side of neck and Rt mid back, pain minimal today.    Pertinent History  fibromyalgia    Patient Stated Goals  less pain    Currently in Pain?  Yes    Pain Score  1     Pain Location  Neck    Pain Orientation  Left;Right    Pain Descriptors / Indicators  --   Twing   Pain Type  Chronic pain    Pain Onset  More  than a month ago    Pain Frequency  Intermittent    Aggravating Factors   lifting    Pain Relieving Factors  exercises    Effect of Pain on Daily Activities  just keeps going         [facility] PT Assessment - 05/05/19 0001      Assessment   Medical Diagnosis  Cervical radiculopathy    Referring Provider (PT)  Laroy Apple     Onset Date/Surgical Date  02/12/19    Hand Dominance  Right    Next MD Visit  April after therapy    Prior Therapy  none      Precautions   Precautions  None                   [facility] Adult PT Treatment/Exercise - 05/05/19 0001      Posture/Postural Control   Posture/Postural Control  Postural limitations    Postural Limitations  Rounded Shoulders;Forward head;Increased thoracic kyphosis      Exercises   Exercises  Neck      Neck Exercises: Machines for  Strengthening   UBE (Upper Arm Bike)  4' backwards L1      Neck Exercises: Standing   Wall Push Ups  15 reps      Neck Exercises: Prone   Neck Retraction  10 reps;3 secs    Neck Retraction Limitations  chin tuck head lift    Shoulder Extension  10 reps    Shoulder Extension Weights (lbs)  2    Rows  10 reps    Rows Weights (lbs)  2      Manual Therapy   Manual Therapy  Soft tissue mobilization;Manual Traction    Soft tissue mobilization  to decrease pain and mm spasm     Manual Traction  to decrease pain                PT Short Term Goals - 04/24/19 1400      PT SHORT TERM GOAL #1   Title  Pt to be I in HEP to improve decrease her pain to no greater than a 2/10 throughout the day.    Time  3    Period  Weeks    Status  Achieved    Target Date  05/03/19      PT SHORT TERM GOAL #2   Title  PT to have no more than one headache a week    Time  3    Period  Weeks    Status  Achieved      PT SHORT TERM GOAL #3   Title  PT pain to radiate no further than her elbow to demonstrate decreased nerve root irritation    Time  3    Status  On-going      PT SHORT TERM GOAL  #4   Title  Pt to be able to rotate cervical spine at least 65 degrees in both directions for safer driving    Time  3    Period  Weeks    Status  On-going        PT Long Term Goals - 04/24/19 1401      PT LONG TERM GOAL #1   Title  PT to be I in advance HEP to allow cervical strength to be at least 4+/5 to allow pt to lift dishes into higher cabinets without difficulty.    Time  6    Period  Weeks    Status  On-going      PT LONG TERM GOAL #2   Title  PT to have had no headaches in the past two weeks.    Time  6    Period  Weeks    Status  On-going      PT LONG TERM GOAL #3   Title  PT to have had no radicular sx in the past week    Time  6    Period  Weeks    Status  Partially Met            Plan - 05/05/19 1438    Clinical Impression Statement  Added posterior UBE and resumed standing and prone postural strengthening exercises.  Manual soft tissue mobilization complete to address tight neck musculature for pain control.  EOS no reoprts of pain and stated "the catch" was gone.    Personal Factors and Comorbidities  Comorbidity 2    Comorbidities  fibromyalgia, lumbago    Examination-Activity Limitations  Carry;Sit;Sleep    Examination-Participation Restrictions  Cleaning;Yard Work    Stability/Clinical Decision Making  Stable/Uncomplicated  Clinical Decision Making  Low    Rehab Potential  Good    PT Frequency  2x / week    PT Duration  6 weeks    PT Treatment/Interventions  Dry needling;Passive range of motion;Manual techniques;Patient/family education;Therapeutic activities;Therapeutic exercise    PT Next Visit Plan  Progress postural strenghtening and manual PRN for pain control and to reduce tightness.    PT Home Exercise Plan  EVAL:  cervical extension, cervical and scapular retraction as well as tall sitting; 2/22: cervical  isometric and supine decompressive exercises 2/24: RTB with decompression3/1 :  upper trap stretch, prone rows and hip extension.        Patient will benefit from skilled therapeutic intervention in order to improve the following deficits and impairments:  Decreased range of motion, Decreased strength, Pain, Postural dysfunction  Visit Diagnosis: Cervicalgia  Radiculopathy, cervical region     Problem List Patient Active Problem List   Diagnosis Date Noted  . Elevated blood pressure reading 07/02/2016  . Elevated LFTs 04/15/2016  . Family history of colonic polyps 04/15/2016  . Rectal bleeding 04/15/2016  . POLYCYSTIC OVARIAN DISEASE 12/20/2006  . CARPAL TUNNEL SYNDROME 12/20/2006   Ihor Austin, LPTA/CLT; CBIS [phone]  Aldona Lento 05/05/2019, 2:41 PM  [facility] [address] [place], [place], [address] Phone: [phone]   Fax:  [phone]  Name: Emily Colon MRN: [id number] Date of Birth: 10/22/1980

## 2019-05-08 ENCOUNTER — Other Ambulatory Visit: Payer: Self-pay

## 2019-05-08 ENCOUNTER — Encounter (HOSPITAL_COMMUNITY): Payer: Self-pay | Admitting: Physical Therapy

## 2019-05-08 ENCOUNTER — Ambulatory Visit (HOSPITAL_COMMUNITY): Payer: BC Managed Care – PPO | Admitting: Physical Therapy

## 2019-05-08 DIAGNOSIS — M5412 Radiculopathy, cervical region: Secondary | ICD-10-CM

## 2019-05-08 DIAGNOSIS — M542 Cervicalgia: Secondary | ICD-10-CM

## 2019-05-08 NOTE — Therapy (Signed)
[place], [place], [address] Phone: [phone]   Fax:  [phone]  Physical Therapy Treatment  Patient Details  Name: Emily Colon MRN: [id number] Date of Birth: 02-18-1981 Referring Provider (PT): Laroy Apple    Encounter Date: 05/08/2019  PT End of Session - 05/08/19 1140    Visit Number  8    Number of Visits  12    Date for PT Re-Evaluation  05/24/19    Authorization Type  bcbs    Authorization Time Period  Cert: 7/09-->08/21/34    Authorization - Visit Number  8    Authorization - Number of Visits  12    Progress Note Due on Visit  10    PT Start Time  1130    PT Stop Time  1214    PT Time Calculation (min)  44 min    Activity Tolerance  Patient tolerated treatment well    Behavior During Therapy  [facility] for tasks assessed/performed       Past Medical History:  Diagnosis Date  . Hypertension   . Kidney stone   . PONV (postoperative nausea and vomiting)     Past Surgical History:  Procedure Laterality Date  . CARPAL TUNNEL RELEASE  2005 and 2006   bilateral    also cubital tunnel  . CHOLECYSTECTOMY  11/07/2010   Procedure: LAPAROSCOPIC CHOLECYSTECTOMY;  Surgeon: Donato Heinz;  Location: [facility];  Service: General;  Laterality: N/A;  . COLONOSCOPY N/A 04/30/2016   Procedure: COLONOSCOPY;  Surgeon: Danie Binder, MD;  [facility];  Service: Endoscopy;  Laterality: N/A;  9:45am  . URETHRA SURGERY  age 39  . WISDOM TOOTH EXTRACTION  2001    There were no vitals filed for this visit.  Subjective Assessment - 05/08/19 1141    Subjective  PT has no pain she has been doing well with her exercises.    Pertinent History  fibromyalgia    Patient Stated Goals  less pain    Currently in Pain?  No/denies    Pain Onset  More than a month ago             [facility] Adult PT Treatment/Exercise - 05/08/19 0001      Posture/Postural Control   Posture/Postural Control  Postural  limitations    Postural Limitations  Rounded Shoulders;Forward head;Increased thoracic kyphosis      Exercises   Exercises  Neck      Neck Exercises: Machines for Strengthening   UBE (Upper Arm Bike)  4' backwards L2      Neck Exercises: Standing   Neck Retraction  10 reps    Wall Push Ups  15 reps    Upper Extremity Flexion with Stabilization  Flexion;5 reps    Other Standing Exercises  Y lift off x 10       Neck Exercises: Prone   Neck Retraction  10 reps;3 secs    Neck Retraction Limitations  chin tuck head lift    Shoulder Extension  10 reps    Shoulder Extension Weights (lbs)  2    Rows  10 reps    Rows Weights (lbs)  2      Manual Therapy   Manual Therapy  Joint mobilization;Soft tissue mobilization;Manual Traction    Soft tissue mobilization  to decrease pain and mm spasm     Manual Traction  to decrease pain  PT Short Term Goals - 05/08/19 1216      PT SHORT TERM GOAL #1   Title  Pt to be I in HEP to improve decrease her pain to no greater than a 2/10 throughout the day.    Time  3    Period  Weeks    Status  Achieved    Target Date  05/03/19      PT SHORT TERM GOAL #2   Title  PT to have no more than one headache a week    Time  3    Period  Weeks    Status  Achieved      PT SHORT TERM GOAL #3   Title  PT pain to radiate no further than her elbow to demonstrate decreased nerve root irritation    Time  3    Status  On-going      PT SHORT TERM GOAL #4   Title  Pt to be able to rotate cervical spine at least 65 degrees in both directions for safer driving    Time  3    Period  Weeks    Status  On-going        PT Long Term Goals - 05/08/19 1217      PT LONG TERM GOAL #1   Title  PT to be I in advance HEP to allow cervical strength to be at least 4+/5 to allow pt to lift dishes into higher cabinets without difficulty.    Time  6    Period  Weeks    Status  On-going      PT LONG TERM GOAL #2   Title  PT to have had no  headaches in the past two weeks.    Time  6    Period  Weeks    Status  On-going      PT LONG TERM GOAL #3   Title  PT to have had no radicular sx in the past week    Time  6    Period  Weeks    Status  Partially Met            Plan - 05/08/19 1214    Clinical Impression Statement  Added Y lift off as well as stabilization with B UE flextion to promote advanced stabilization.  Pt continues to be more aware of posture and able to self correct.    Personal Factors and Comorbidities  Comorbidity 2    Comorbidities  fibromyalgia, lumbago    Examination-Activity Limitations  Carry;Sit;Sleep    Examination-Participation Restrictions  Cleaning;Yard Work    Stability/Clinical Decision Making  Stable/Uncomplicated    Rehab Potential  Good    PT Frequency  2x / week    PT Duration  6 weeks    PT Treatment/Interventions  Dry needling;Passive range of motion;Manual techniques;Patient/family education;Therapeutic activities;Therapeutic exercise    PT Next Visit Plan  complete postural tband exercises.    PT Home Exercise Plan  EVAL:  cervical extension, cervical and scapular retraction as well as tall sitting; 2/22: cervical  isometric and supine decompressive exercises 2/24: RTB with decompression3/1 :  upper trap stretch, prone rows and hip extension.       Patient will benefit from skilled therapeutic intervention in order to improve the following deficits and impairments:  Decreased range of motion, Decreased strength, Pain, Postural dysfunction  Visit Diagnosis: Cervicalgia  Radiculopathy, cervical region     Problem List Patient Active Problem List   Diagnosis Date  Noted  . Elevated blood pressure reading 07/02/2016  . Elevated LFTs 04/15/2016  . Family history of colonic polyps 04/15/2016  . Rectal bleeding 04/15/2016  . POLYCYSTIC OVARIAN DISEASE 12/20/2006  . CARPAL TUNNEL SYNDROME 12/20/2006   Rayetta Humphrey, PT CLT [phone] 05/08/2019, 12:17 PM  [facility] [address] [place], [place], [address] Phone: [phone]   Fax:  [phone]  Name: Emily Colon MRN: [id number] Date of Birth: [birth year]

## 2019-05-10 ENCOUNTER — Ambulatory Visit (HOSPITAL_COMMUNITY): Payer: BC Managed Care – PPO

## 2019-05-10 ENCOUNTER — Other Ambulatory Visit: Payer: Self-pay

## 2019-05-10 ENCOUNTER — Encounter (HOSPITAL_COMMUNITY): Payer: Self-pay

## 2019-05-10 DIAGNOSIS — M5412 Radiculopathy, cervical region: Secondary | ICD-10-CM | POA: Diagnosis not present

## 2019-05-10 DIAGNOSIS — M542 Cervicalgia: Secondary | ICD-10-CM

## 2019-05-10 NOTE — Therapy (Signed)
[place], [place], [address] Phone: [phone]   Fax:  [phone]  Physical Therapy Treatment  Patient Details  Name: Emily Colon MRN: [id number] Date of Birth: [birth year] Referring Provider (PT): Laroy Apple    Encounter Date: 05/10/2019  PT End of Session - 05/10/19 1212    Visit Number  9    Number of Visits  12    Date for PT Re-Evaluation  05/24/19    Authorization Type  bcbs    Authorization Time Period  Cert: 6/96-->2/95/28    Authorization - Visit Number  9    Authorization - Number of Visits  12    Progress Note Due on Visit  10    PT Start Time  1125    PT Stop Time  1205    PT Time Calculation (min)  40 min    Activity Tolerance  Patient tolerated treatment well    Behavior During Therapy  [facility] for tasks assessed/performed       Past Medical History:  Diagnosis Date  . Hypertension   . Kidney stone   . PONV (postoperative nausea and vomiting)     Past Surgical History:  Procedure Laterality Date  . CARPAL TUNNEL RELEASE  2005 and 2006   bilateral    also cubital tunnel  . CHOLECYSTECTOMY  11/07/2010   Procedure: LAPAROSCOPIC CHOLECYSTECTOMY;  Surgeon: Donato Heinz;  Location: [facility];  Service: General;  Laterality: N/A;  . COLONOSCOPY N/A 04/30/2016   Procedure: COLONOSCOPY;  Surgeon: Danie Binder, MD;  [facility];  Service: Endoscopy;  Laterality: N/A;  9:45am  . URETHRA SURGERY  age 39  . WISDOM TOOTH EXTRACTION  2001    There were no vitals filed for this visit.  Subjective Assessment - 05/10/19 1126    Subjective  Pt stated she is feeling good and becoming more aware of her posture.    Pertinent History  fibromyalgia    Patient Stated Goals  less pain    Currently in Pain?  No/denies                       [facility] Adult PT Treatment/Exercise - 05/10/19 0001      Posture/Postural Control   Posture/Postural Control  Postural limitations    Postural Limitations  Rounded Shoulders;Forward head;Increased thoracic kyphosis      Exercises   Exercises  Neck      Neck Exercises: Machines for Strengthening   UBE (Upper Arm Bike)  4' backwards L2      Neck Exercises: Theraband   Scapula Retraction  15 reps;Green    Scapula Retraction Limitations  Reviewed form, given piece to fit in door jam    Shoulder Extension  15 reps;Green    Shoulder Extension Limitations  Reviewed form, given piece to fit in door jam    Rows  15 reps;Green    Rows Limitations  Reviewed form, given piece to fit in door jam    Horizontal ABduction  15 reps;Green      Neck Exercises: Standing   Wall Push Ups  15 reps    Upper Extremity Flexion with Stabilization  Flexion;10 reps    UE Flexion with Stabilization Limitations  with neck retraction    Other Standing Exercises  Y lift off x 10       Neck Exercises: Prone   Neck Retraction  10 reps;3 secs    Neck Retraction  Limitations  chin tuck head lift    Shoulder Extension  10 reps    Shoulder Extension Weights (lbs)  held # for pain control    Rows  10 reps    Rows Weights (lbs)  held # for pain control    Upper Extremity Flexion with Stabilization  Flexion;10 reps      Manual Therapy   Manual Therapy  Soft tissue mobilization    Manual therapy comments  done seperate from all other aspects of treatment    Soft tissue mobilization  to decrease pain and mm spasm     Manual Traction  to decrease pain                PT Short Term Goals - 05/08/19 1216      PT SHORT TERM GOAL #1   Title  Pt to be I in HEP to improve decrease her pain to no greater than a 2/10 throughout the day.    Time  3    Period  Weeks    Status  Achieved    Target Date  05/03/19      PT SHORT TERM GOAL #2   Title  PT to have no more than one headache a week    Time  3    Period  Weeks    Status  Achieved      PT SHORT TERM GOAL #3   Title  PT pain to radiate no further than her elbow to demonstrate decreased  nerve root irritation    Time  3    Status  On-going      PT SHORT TERM GOAL #4   Title  Pt to be able to rotate cervical spine at least 65 degrees in both directions for safer driving    Time  3    Period  Weeks    Status  On-going        PT Long Term Goals - 05/08/19 1217      PT LONG TERM GOAL #1   Title  PT to be I in advance HEP to allow cervical strength to be at least 4+/5 to allow pt to lift dishes into higher cabinets without difficulty.    Time  6    Period  Weeks    Status  On-going      PT LONG TERM GOAL #2   Title  PT to have had no headaches in the past two weeks.    Time  6    Period  Weeks    Status  On-going      PT LONG TERM GOAL #3   Title  PT to have had no radicular sx in the past week    Time  6    Period  Weeks    Status  Partially Met            Plan - 05/10/19 1213    Clinical Impression Statement  Added shoulder flexion in prone for scapular stability.  Reviewed form with theraband as HEP, pt given additional piece to assist with doorway.  Pt continues to be aware of posture and able to self correct.    Personal Factors and Comorbidities  Comorbidity 2    Comorbidities  fibromyalgia, lumbago    Examination-Activity Limitations  Carry;Sit;Sleep    Examination-Participation Restrictions  Cleaning;Yard Work    Stability/Clinical Decision Making  Stable/Uncomplicated    [employer]er, [employer]ellery  Low    Rehab Potential  Good  PT Frequency  2x / week    PT Duration  6 weeks    PT Treatment/Interventions  Dry needling;Passive range of motion;Manual techniques;Patient/family education;Therapeutic activities;Therapeutic exercise    PT Next Visit Plan  10th visit next session.  Continue to progress postural strengthening.    PT Home Exercise Plan  EVAL:  cervical extension, cervical and scapular retraction as well as tall sitting; 2/22: cervical  isometric and supine decompressive exercises 2/24: RTB with decompression3/1 :  upper trap stretch,  prone rows and hip extension.       Patient will benefit from skilled therapeutic intervention in order to improve the following deficits and impairments:  Decreased range of motion, Decreased strength, Pain, Postural dysfunction  Visit Diagnosis: Radiculopathy, cervical region  Cervicalgia     Problem List Patient Active Problem List   Diagnosis Date Noted  . Elevated blood pressure reading 07/02/2016  . Elevated LFTs 04/15/2016  . Family history of colonic polyps 04/15/2016  . Rectal bleeding 04/15/2016  . POLYCYSTIC OVARIAN DISEASE 12/20/2006  . CARPAL TUNNEL SYNDROME 12/20/2006   Ihor Austin, LPTA/CLT; CBIS [phone]  Aldona Lento 05/10/2019, 12:16 PM  [facility] [address] [place], [place], [address] Phone: [phone]   Fax:  [phone]  Name: Emily Colon MRN: [id number] Date of Birth: 22-May-1980

## 2019-05-15 ENCOUNTER — Other Ambulatory Visit: Payer: Self-pay

## 2019-05-15 ENCOUNTER — Ambulatory Visit (HOSPITAL_COMMUNITY): Payer: BC Managed Care – PPO | Admitting: Physical Therapy

## 2019-05-15 DIAGNOSIS — M5412 Radiculopathy, cervical region: Secondary | ICD-10-CM

## 2019-05-15 DIAGNOSIS — M542 Cervicalgia: Secondary | ICD-10-CM

## 2019-05-15 NOTE — Therapy (Signed)
[facility] [address] [place], [place], [address] Phone: [phone]   Fax:  [phone]  Physical Therapy Treatment Progress Note Reporting Period  04/12/2019  to 05/15/2019  See note below for Objective Data and Assessment of Progress/Goals.      Patient Details  Name: Emily Colon MRN: [id number] Date of Birth: [birth year] Referring Provider (PT): Laroy Apple    Encounter Date: 05/15/2019  PT End of Session - 05/15/19 1538    Visit Number  10    Number of Visits  12    Date for PT Re-Evaluation  05/24/19    Authorization Type  bcbs    Authorization Time Period  Cert: 4/96-->7/59/16    Authorization - Visit Number  10    Authorization - Number of Visits  12    Progress Note Due on Visit  20    PT Start Time  3846    PT Stop Time  1534    PT Time Calculation (min)  41 min    Activity Tolerance  Patient tolerated treatment well    Behavior During Therapy  [facility] for tasks assessed/performed       Past Medical History:  Diagnosis Date  . Hypertension   . Kidney stone   . PONV (postoperative nausea and vomiting)     Past Surgical History:  Procedure Laterality Date  . CARPAL TUNNEL RELEASE  2005 and 2006   bilateral    also cubital tunnel  . CHOLECYSTECTOMY  11/07/2010   Procedure: LAPAROSCOPIC CHOLECYSTECTOMY;  Surgeon: Donato Heinz;  Location: [facility];  Service: General;  Laterality: N/A;  . COLONOSCOPY N/A 04/30/2016   Procedure: COLONOSCOPY;  Surgeon: Danie Binder, MD;  [facility];  Service: Endoscopy;  Laterality: N/A;  9:45am  . URETHRA SURGERY  age 49  . WISDOM TOOTH EXTRACTION  2001    There were no vitals filed for this visit.  Subjective Assessment - 05/15/19 1721    Subjective  Pt states she is overall improving and has better postural awarenss.  States she is working on her work station to better improve this as well.  Currently without pain.    Currently in Pain?  No/denies          [facility] PT Assessment - 05/15/19 1459      Assessment   Medical Diagnosis  Cervical radiculopathy    Referring Provider (PT)  Laroy Apple     Onset Date/Surgical Date  02/12/19    Hand Dominance  Right    Next MD Visit  after therapy     Prior Therapy  none      Precautions   Precautions  None      Restrictions   Weight Bearing Restrictions  No      Prior Function   Level of Independence  Independent    Vocation  Full time employment    Vocation Requirements  sitting, pushing , pulling       Cognition   Overall Cognitive Status  Within Functional Limits for tasks assessed      Observation/Other Assessments   Focus on Therapeutic Outcomes (FOTO)   33% limited.  Was 37%limited      Posture/Postural Control   Posture/Postural Control  Postural limitations    Postural Limitations  Rounded Shoulders;Forward head;Increased thoracic kyphosis    Posture Comments  --      AROM   Cervical Flexion  --   WNL   Cervical Extension  --  WNL   Cervical - Right Side Bend  35   was 25 degrees   Cervical - Left Side Bend  40   was 35 degrees   Cervical - Right Rotation  75   was 55 degrees   Cervical - Left Rotation  70   was 60 degrees     Strength   Cervical Extension  4+/5   was 3+   Cervical - Right Side Bend  4+/5   was 3+   Cervical - Left Side Bend  4+/5   was 3+     Palpation   Palpation comment  mild to absent mm spasms (were marked)                   [facility] Adult PT Treatment/Exercise - 05/15/19 1459      Manual Therapy   Manual Therapy  Soft tissue mobilization    Manual therapy comments  done seperate from all other aspects of treatment    Soft tissue mobilization  prone to Lt UT and Rt thoracic paraspinals to decrease pain and mm spasm                PT Short Term Goals - 05/15/19 1511      PT SHORT TERM GOAL #1   Title  Pt to be I in HEP to improve decrease her pain to no greater than a 2/10 throughout the day.    Time  3     Period  Weeks    Status  Achieved    Target Date  05/03/19      PT SHORT TERM GOAL #2   Title  PT to have no more than one headache a week    Time  3    Period  Weeks    Status  Achieved      PT SHORT TERM GOAL #3   Title  PT pain to radiate no further than her elbow to demonstrate decreased nerve root irritation    Time  3    Status  Achieved      PT SHORT TERM GOAL #4   Title  Pt to be able to rotate cervical spine at least 65 degrees in both directions for safer driving    Time  3    Period  Weeks    Status  Achieved        PT Long Term Goals - 05/15/19 1519      PT LONG TERM GOAL #1   Title  PT to be I in advance HEP to allow cervical strength to be at least 4+/5 to allow pt to lift dishes into higher cabinets without difficulty.    Time  6    Period  Weeks    Status  Achieved      PT LONG TERM GOAL #2   Title  PT to have had no headaches in the past two weeks.    Time  6    Period  Weeks    Status  On-going      PT LONG TERM GOAL #3   Title  PT to have had no radicular sx in the past week    Time  6    Period  Weeks    Status  On-going            Plan - 05/15/19 1718    Clinical Impression Statement  Progress note completed today with overall progress made.  Pt has met all STG's and  completed 1/3 LTG's.  Pt is having occasional tingling into fingers but nothing like it was.  Manual completed with very mild tightness and no spasms palpated.  Cervical ROM and strength have improved and patient feels she is obtaining the tools she needs for upcoming discharge.  Pt will conitnue to benefit from additional visits to update to advanced HEP and work on Economist and posture.    Personal Factors and Comorbidities  Comorbidity 2    Comorbidities  fibromyalgia, lumbago    Examination-Activity Limitations  Carry;Sit;Sleep    Examination-Participation Restrictions  Cleaning;Yard Work    Stability/Clinical Decision Making  Stable/Uncomplicated    Rehab  Potential  Good    PT Frequency  2x / week    PT Duration  6 weeks    PT Treatment/Interventions  Dry needling;Passive range of motion;Manual techniques;Patient/family education;Therapeutic activities;Therapeutic exercise    PT Next Visit Plan  Continue to progress postural strengthening for remaining visits, manual as needed    PT Home Exercise Plan  EVAL:  cervical extension, cervical and scapular retraction as well as tall sitting; 2/22: cervical  isometric and supine decompressive exercises 2/24: RTB with decompression3/1 :  upper trap stretch, prone rows and hip extension.       Patient will benefit from skilled therapeutic intervention in order to improve the following deficits and impairments:  Decreased range of motion, Decreased strength, Pain, Postural dysfunction  Visit Diagnosis: Radiculopathy, cervical region  Cervicalgia     Problem List Patient Active Problem List   Diagnosis Date Noted  . Elevated blood pressure reading 07/02/2016  . Elevated LFTs 04/15/2016  . Family history of colonic polyps 04/15/2016  . Rectal bleeding 04/15/2016  . POLYCYSTIC OVARIAN DISEASE 12/20/2006  . CARPAL TUNNEL SYNDROME 12/20/2006   Teena Irani, PTA/CLT [phone]  Teena Irani 05/15/2019, 5:22 PM  [facility] [address] [place], [place], [address] Phone: [phone]   Fax:  [phone]  Name: EULETA BELSON MRN: [id number] Date of Birth: [birth year]

## 2019-05-17 ENCOUNTER — Other Ambulatory Visit: Payer: Self-pay

## 2019-05-17 ENCOUNTER — Encounter (HOSPITAL_COMMUNITY): Payer: Self-pay | Admitting: Physical Therapy

## 2019-05-17 ENCOUNTER — Ambulatory Visit (HOSPITAL_COMMUNITY): Payer: BC Managed Care – PPO | Admitting: Physical Therapy

## 2019-05-17 DIAGNOSIS — M5412 Radiculopathy, cervical region: Secondary | ICD-10-CM | POA: Diagnosis not present

## 2019-05-17 DIAGNOSIS — M542 Cervicalgia: Secondary | ICD-10-CM

## 2019-05-17 NOTE — Therapy (Signed)
[facility] [address] [place], [place], [address] Phone: [phone]   Fax:  [phone]  Physical Therapy Treatment  Patient Details  Name: Emily Colon MRN: [id number] Date of Birth: 1980-04-13 Referring Provider (PT): Romero Belling    Encounter Date: 05/17/2019  PT End of Session - 05/17/19 1155    Visit Number  11    Number of Visits  12    Date for PT Re-Evaluation  05/24/19    Authorization Type  bcbs    Authorization Time Period  Cert: 3/89-->3/73/42    Authorization - Visit Number  11    Authorization - Number of Visits  12    Progress Note Due on Visit  20    PT Start Time  1140    PT Stop Time  1220    PT Time Calculation (min)  40 min    Activity Tolerance  Patient tolerated treatment well    Behavior During Therapy  [facility] for tasks assessed/performed       Past Medical History:  Diagnosis Date  . Hypertension   . Kidney stone   . PONV (postoperative nausea and vomiting)     Past Surgical History:  Procedure Laterality Date  . CARPAL TUNNEL RELEASE  2005 and 2006   bilateral    also cubital tunnel  . CHOLECYSTECTOMY  11/07/2010   Procedure: LAPAROSCOPIC CHOLECYSTECTOMY;  Surgeon: Fabio Bering;  Location: [facility];  Service: General;  Laterality: N/A;  . COLONOSCOPY N/A 04/30/2016   Procedure: COLONOSCOPY;  Surgeon: West Bali, MD;  [facility];  Service: Endoscopy;  Laterality: N/A;  9:45am  . URETHRA SURGERY  age 39  . WISDOM TOOTH EXTRACTION  2001    There were no vitals filed for this visit.  Subjective Assessment - 05/17/19 1138    Subjective  Pt has noticed the improvement in cervical rotation especially when driving    Pertinent History  fibromyalgia    How long can you sit comfortably?  30 minutes    Patient Stated Goals  less pain    Currently in Pain?  No/denies           [facility] Adult PT Treatment/Exercise - 05/17/19 0001      Posture/Postural Control   Posture/Postural  Control  Postural limitations    Postural Limitations  Rounded Shoulders;Forward head;Increased thoracic kyphosis      Exercises   Exercises  Neck      Neck Exercises: Machines for Strengthening   UBE (Upper Arm Bike)  4' backwards L2      Neck Exercises: Theraband   Other Theraband Exercises  punch downs green tband x 10 each       Neck Exercises: Standing   Upper Extremity Flexion with Stabilization  Flexion;15 reps    Other Standing Exercises  simulation with 4#(2 -2# wt) for putting dishes up into cabinet as well as pulling laundry out of dryer.      Neck Exercises: Supine   Neck Retraction  5 reps    Neck Retraction Limitations  scapular retraction x 5       Neck Exercises: Sidelying   Lateral Flexion  Both;10 reps      Manual Therapy   Manual Therapy  Soft tissue mobilization;Passive ROM;Manual Traction    Manual therapy comments  done seperate from all other aspects of treatment    Soft tissue mobilization   to Lt UT and Rt thoracic paraspinals to decrease pain and  mm spasm     Manual Traction  to decrease pain                PT Short Term Goals - 05/15/19 1511      PT SHORT TERM GOAL #1   Title  Pt to be I in HEP to improve decrease her pain to no greater than a 2/10 throughout the day.    Time  3    Period  Weeks    Status  Achieved    Target Date  05/03/19      PT SHORT TERM GOAL #2   Title  PT to have no more than one headache a week    Time  3    Period  Weeks    Status  Achieved      PT SHORT TERM GOAL #3   Title  PT pain to radiate no further than her elbow to demonstrate decreased nerve root irritation    Time  3    Status  Achieved      PT SHORT TERM GOAL #4   Title  Pt to be able to rotate cervical spine at least 65 degrees in both directions for safer driving    Time  3    Period  Weeks    Status  Achieved        PT Long Term Goals - 05/15/19 1519      PT LONG TERM GOAL #1   Title  PT to be I in advance HEP to allow cervical  strength to be at least 4+/5 to allow pt to lift dishes into higher cabinets without difficulty.    Time  6    Period  Weeks    Status  Achieved      PT LONG TERM GOAL #2   Title  PT to have had no headaches in the past two weeks.    Time  6    Period  Weeks    Status  On-going      PT LONG TERM GOAL #3   Title  PT to have had no radicular sx in the past week    Time  6    Period  Weeks    Status  On-going            Plan - 05/17/19 1156    Clinical Impression Statement  PT much more aware of posture; able to self correct to relieve any indication of neural impingement.  Added punch downs to attempt to decrease scauplar elevation and promote relaxation of mm.  Added sidelying sidebend to improve strength.    Personal Factors and Comorbidities  Comorbidity 2    Comorbidities  fibromyalgia, lumbago    Examination-Activity Limitations  Carry;Sit;Sleep    Examination-Participation Restrictions  Cleaning;Yard Work    Stability/Clinical Decision Making  Stable/Uncomplicated    Rehab Potential  Good    PT Frequency  2x / week    PT Duration  6 weeks    PT Treatment/Interventions  Dry needling;Passive range of motion;Manual techniques;Patient/family education;Therapeutic activities;Therapeutic exercise    PT Next Visit Plan  PT to be discharged next visit.  Therapist to answer any questions pt may have.    PT Home Exercise Plan  EVAL:  cervical extension, cervical and scapular retraction as well as tall sitting; 2/22: cervical  isometric and supine decompressive exercises 2/24: RTB with decompression3/1 :  upper trap stretch, prone rows and hip extension.       Patient will  benefit from skilled therapeutic intervention in order to improve the following deficits and impairments:  Decreased range of motion, Decreased strength, Pain, Postural dysfunction  Visit Diagnosis: Cervicalgia  Radiculopathy, cervical region     Problem List Patient Active Problem List   Diagnosis Date  Noted  . Elevated blood pressure reading 07/02/2016  . Elevated LFTs 04/15/2016  . Family history of colonic polyps 04/15/2016  . Rectal bleeding 04/15/2016  . POLYCYSTIC OVARIAN DISEASE 12/20/2006  . CARPAL TUNNEL SYNDROME 12/20/2006    Rayetta Humphrey, PT CLT [phone] 05/17/2019, 12:29 PM  [facility] [address] [place], [place], [address] Phone: [phone]   Fax:  [phone]  Name: ARITZA BRUNET MRN: [id number] Date of Birth: 05-12-1980

## 2019-05-22 ENCOUNTER — Encounter (HOSPITAL_COMMUNITY): Payer: BC Managed Care – PPO | Admitting: Physical Therapy

## 2019-05-24 ENCOUNTER — Other Ambulatory Visit: Payer: Self-pay

## 2019-05-24 ENCOUNTER — Ambulatory Visit (HOSPITAL_COMMUNITY): Payer: BC Managed Care – PPO | Admitting: Physical Therapy

## 2019-05-24 DIAGNOSIS — M542 Cervicalgia: Secondary | ICD-10-CM

## 2019-05-24 DIAGNOSIS — M5412 Radiculopathy, cervical region: Secondary | ICD-10-CM | POA: Diagnosis not present

## 2019-05-24 NOTE — Therapy (Signed)
[facility] [address] [place], [place], [address] Phone: [phone]   Fax:  [phone]  Physical Therapy Treatment  Patient Details  Name: Emily Colon MRN: [id number] Date of Birth: [birth year] Referring Provider (PT): Laroy Apple   PHYSICAL THERAPY DISCHARGE SUMMARY  Visits from Start of Care: 12  Current functional level related to goals / functional outcomes: Pt able to control headaches and radicular sx; improved ROM    Remaining deficits: Some minor strength deficits on Lt side    Education / Equipment: Given  Plan: Patient agrees to discharge.  Patient goals were met. Patient is being discharged due to being pleased with the current functional level.  ?????     Encounter Date: 05/24/2019  PT End of Session - 05/24/19 1031    Visit Number  12    Number of Visits  12    Date for PT Re-Evaluation  05/24/19    Authorization Type  bcbs    Authorization Time Period  Cert: 8/10-->1/75/10    Authorization - Visit Number  12    Authorization - Number of Visits  12    Progress Note Due on Visit  20    PT Start Time  0915    PT Stop Time  0945    PT Time Calculation (min)  30 min    Activity Tolerance  Patient tolerated treatment well    Behavior During Therapy  WFL for tasks assessed/performed       Past Medical History:  Diagnosis Date  . Hypertension   . Kidney stone   . PONV (postoperative nausea and vomiting)     Past Surgical History:  Procedure Laterality Date  . CARPAL TUNNEL RELEASE  2005 and 2006   bilateral    also cubital tunnel  . CHOLECYSTECTOMY  11/07/2010   Procedure: LAPAROSCOPIC CHOLECYSTECTOMY;  Surgeon: Donato Heinz;  Location: [facility];  Service: General;  Laterality: N/A;  . COLONOSCOPY N/A 04/30/2016   Procedure: COLONOSCOPY;  Surgeon: Danie Binder, MD;  [facility];  Service: Endoscopy;  Laterality: N/A;  9:45am  . URETHRA SURGERY  age 89  . WISDOM TOOTH EXTRACTION   2001    There were no vitals filed for this visit.  Subjective Assessment - 05/24/19 1023    Subjective  Pt states that she was excited because she felt a headache coming on and recorrected her posturre and was able to dissapate the headache.    Pertinent History  fibromyalgia    How long can you sit comfortably?  over an hour was 30 mintues    Patient Stated Goals  less pain    Currently in Pain?  No/denies         [facility] PT Assessment - 05/24/19 0001      Assessment   Medical Diagnosis  Cervical radiculopathy    Referring Provider (PT)  Laroy Apple     Onset Date/Surgical Date  02/12/19    Hand Dominance  Right    Next MD Visit  after therapy     Prior Therapy  none      Precautions   Precautions  None      Restrictions   Weight Bearing Restrictions  No      Prior Function   Level of Independence  Independent    Vocation  Full time employment    Vocation Requirements  sitting, pushing , pulling       Cognition   Overall Cognitive  Status  Within Functional Limits for tasks assessed      Observation/Other Assessments   Focus on Therapeutic Outcomes (FOTO)   26% limited.  Was 37%limited      Posture/Postural Control   Posture/Postural Control  Postural limitations    Postural Limitations  Rounded Shoulders;Forward head;Increased thoracic kyphosis      AROM   Cervical Flexion  --   WNL   Cervical Extension  --   WNL   Cervical - Right Side Bend  48   was 25 degrees   Cervical - Left Side Bend  40   was 35 degrees   Cervical - Right Rotation  80   was 55 degrees   Cervical - Left Rotation  75   was 60 degrees     Strength   Cervical Extension  5/5   was 3+   Cervical - Right Side Bend  5/5   was 3+   Cervical - Left Side Bend  4+/5   was 3+     Palpation   Palpation comment  mild to absent mm spasms (were marked)                   [facility] Adult PT Treatment/Exercise - 05/24/19 0001      Manual Therapy   Manual Therapy  Soft tissue  mobilization;Passive ROM;Manual Traction    Manual therapy comments  done seperate from all other aspects of treatment    Soft tissue mobilization   to Lt UT and Rt thoracic paraspinals to decrease pain and mm spasm     Manual Traction  30" x 5             PT Education - 05/24/19 1026    Education Details  The importance of not sitting for greater than an hour without rising for at least a minute and completing some postural exercises.  If radicular sx begin again try to complete exercises to resolve, if this does not occur within a few days request follow up with therapy.    Person(s) Educated  Patient    Methods  Explanation    Comprehension  Verbalized understanding       PT Short Term Goals - 05/24/19 0925      PT SHORT TERM GOAL #1   Title  Pt to be I in HEP to improve decrease her pain to no greater than a 2/10 throughout the day.    Time  3    Period  Weeks    Status  Achieved    Target Date  05/03/19      PT SHORT TERM GOAL #2   Title  PT to have no more than one headache a week    Time  3    Period  Weeks    Status  Achieved      PT SHORT TERM GOAL #3   Title  PT pain to radiate no further than her elbow to demonstrate decreased nerve root irritation    Time  3    Status  Achieved      PT SHORT TERM GOAL #4   Title  Pt to be able to rotate cervical spine at least 65 degrees in both directions for safer driving    Time  3    Period  Weeks    Status  Achieved        PT Long Term Goals - 05/24/19 1032      PT LONG TERM GOAL #1  Title  PT to be I in advance HEP to allow cervical strength to be at least 4+/5 to allow pt to lift dishes into higher cabinets without difficulty.    Time  6    Period  Weeks    Status  Achieved      PT LONG TERM GOAL #2   Title  PT to have had no headaches in the past two weeks.    Time  6    Period  Weeks    Status  Achieved      PT LONG TERM GOAL #3   Title  PT to have had no radicular sx in the past week    Time  6     Period  Weeks    Status  Partially Met            Plan - 05/24/19 1032    Clinical Impression Statement  Pt reassessed with 4/4 STG met and 2/3 LTG met.  Pt feels that she is now able to control her sx whent they come on via exercises and is ready for discharge.    Personal Factors and Comorbidities  Comorbidity 2    Comorbidities  fibromyalgia, lumbago    Examination-Activity Limitations  Carry;Sit;Sleep    Examination-Participation Restrictions  Cleaning;Yard Work    Stability/Clinical Decision Making  Stable/Uncomplicated    Rehab Potential  Good    PT Frequency  2x / week    PT Duration  6 weeks    PT Treatment/Interventions  Dry needling;Passive range of motion;Manual techniques;Patient/family education;Therapeutic activities;Therapeutic exercise    PT Next Visit Plan  Discharge.    PT Home Exercise Plan  EVAL:  cervical extension, cervical and scapular retraction as well as tall sitting; 2/22: cervical  isometric and supine decompressive exercises 2/24: RTB with decompression3/1 :  upper trap stretch, prone rows and hip extension.       Patient will benefit from skilled therapeutic intervention in order to improve the following deficits and impairments:  Decreased range of motion, Decreased strength, Pain, Postural dysfunction  Visit Diagnosis: Cervicalgia  Radiculopathy, cervical region     Problem List Patient Active Problem List   Diagnosis Date Noted  . Elevated blood pressure reading 07/02/2016  . Elevated LFTs 04/15/2016  . Family history of colonic polyps 04/15/2016  . Rectal bleeding 04/15/2016  . POLYCYSTIC OVARIAN DISEASE 12/20/2006  . CARPAL TUNNEL SYNDROME 12/20/2006    Rayetta Humphrey, PT CLT [phone] 05/24/2019, 10:34 AM  [facility] [address] [place], [place], [address] Phone: [phone]   Fax:  [phone]  Name: Emily Colon MRN: [id number] Date of Birth: 1980/10/11

## 2019-05-26 ENCOUNTER — Encounter (HOSPITAL_COMMUNITY): Payer: BC Managed Care – PPO | Admitting: Physical Therapy

## 2019-05-29 ENCOUNTER — Encounter (HOSPITAL_COMMUNITY): Payer: BC Managed Care – PPO | Admitting: Physical Therapy

## 2019-05-30 ENCOUNTER — Ambulatory Visit: Payer: BC Managed Care – PPO | Attending: Internal Medicine

## 2019-05-30 DIAGNOSIS — Z23 Encounter for immunization: Secondary | ICD-10-CM

## 2019-05-30 NOTE — Progress Notes (Signed)
   Covid-19 Vaccination Clinic  Name:  Emily Colon    MRN: [id number] DOB: 06-Jun-1980  05/30/2019  Emily Colon was observed post Covid-19 immunization for 15 minutes without incident. She was provided with Vaccine Information Sheet and instruction to access the V-Safe system.   Emily Colon was instructed to call 911 with any severe reactions post vaccine: Marland Kitchen Difficulty breathing  . Swelling of face and throat  . A fast heartbeat  . A bad rash all over body  . Dizziness and weakness   Immunizations Administered    Name Date Dose VIS Date Route   Moderna COVID-19 Vaccine 05/30/2019  9:32 AM 0.5 mL 01/24/2019 Intramuscular   Manufacturer: Moderna   Lot: [id number]   NDC: 58527-782-42

## 2019-05-31 ENCOUNTER — Encounter (HOSPITAL_COMMUNITY): Payer: BC Managed Care – PPO | Admitting: Physical Therapy

## 2019-06-02 ENCOUNTER — Encounter (HOSPITAL_COMMUNITY): Payer: BC Managed Care – PPO | Admitting: Physical Therapy

## 2019-06-19 ENCOUNTER — Other Ambulatory Visit: Payer: Self-pay | Admitting: Cardiovascular Disease

## 2019-11-08 ENCOUNTER — Encounter (HOSPITAL_COMMUNITY): Payer: Self-pay | Admitting: Physical Therapy

## 2019-12-08 ENCOUNTER — Other Ambulatory Visit: Payer: Self-pay | Admitting: Obstetrics and Gynecology

## 2019-12-08 DIAGNOSIS — N6489 Other specified disorders of breast: Secondary | ICD-10-CM

## 2019-12-21 ENCOUNTER — Other Ambulatory Visit: Payer: Self-pay | Admitting: *Deleted

## 2019-12-21 MED ORDER — NEBIVOLOL HCL 10 MG PO TABS: 10.0000 mg | ORAL_TABLET | Freq: Every day | ORAL | 0 refills | Status: DC

## 2019-12-21 MED ORDER — OLMESARTAN MEDOXOMIL-HCTZ 40-12.5 MG PO TABS: 1.0000 | ORAL_TABLET | Freq: Every day | ORAL | 0 refills | Status: DC

## 2019-12-28 ENCOUNTER — Other Ambulatory Visit: Payer: Self-pay

## 2019-12-28 ENCOUNTER — Ambulatory Visit
Admission: RE | Admit: 2019-12-28 | Discharge: 2019-12-28 | Disposition: A | Discharge status: Home / Self Care | Payer: BC Managed Care – PPO | Source: Ambulatory Visit | Attending: Obstetrics and Gynecology | Admitting: Obstetrics and Gynecology

## 2019-12-28 DIAGNOSIS — N6489 Other specified disorders of breast: Secondary | ICD-10-CM

## 2020-01-07 NOTE — Progress Notes (Signed)
Cardiology Clinic Note   Patient Name: Emily Colon Date of Encounter: 01/08/2020  Primary Care Provider:  Benita Stabile, MD Primary Cardiologist:  Prentice Docker, MD (Inactive)  Patient Profile    Emily Colon 39 year old female presents to the clinic for follow-up evaluation of her essential hypertension.  Past Medical History    Past Medical History:  Diagnosis Date  . Hypertension   . Kidney stone   . PONV (postoperative nausea and vomiting)    Past Surgical History:  Procedure Laterality Date  . CARPAL TUNNEL RELEASE  2005 and 2006   bilateral    also cubital tunnel  . CHOLECYSTECTOMY  11/07/2010   Procedure: LAPAROSCOPIC CHOLECYSTECTOMY;  Surgeon: Fabio Bering;  Location: [facility];  Service: General;  Laterality: N/A;  . COLONOSCOPY N/A 04/30/2016   Procedure: COLONOSCOPY;  Surgeon: West Bali, MD;  Location: [facility];  Service: Endoscopy;  Laterality: N/A;  9:45am  . URETHRA SURGERY  age 39  . WISDOM TOOTH EXTRACTION  2001    Allergies  Allergies  Allergen Reactions  . Gluten Meal Swelling    Joint pain, lethargy   . Other Nausea And Vomiting    Quinoa    History of Present Illness    Emily Colon has a PMH of elevated LFTs, essential hypertension, carpal tunnel syndrome, polycystic ovary disease, and rectal bleeding.  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  She was last seen by Dr. Purvis Sheffield on 09/26/2018.  During that time she denied exertional chest pain, dyspnea, lower extremity swelling, orthopnea and PND.  Her blood pressure was  in the 130s over 90s range at home.  She had planned on making lifestyle changes and cessation of caffeinated beverages.  Her amlodipine was increased to 5 mg twice daily.  She had musculoskeletal chest pain that was relieved by muscle relaxants and ice packs.  She presents the clinic today for follow-up evaluation and states she  feels well.  She has been much more conscious about her diet and staying away from processed type foods.  She does state that she was consuming a large amount of Dr. Reino Kent as well.  She has also reduced her soda intake.  She has some increased stress with her job.  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  She has taken over several of the responsibilities of her boss who has been out on medical leave.  She has been working with members of the community to fill positions however, there are not employees to fill several of these vacancies.  She did find out that the source of her pain was mainly related to her cervical spine.  She has been working with physical therapy, a chiropractor, and massage therapist.  This is helped her neck pain and left arm pain significantly.  She now recognizes when she begins to develop muscle tension in the area and has been able to prevent it.  She has not been taking amlodipine 5 mg daily and her blood pressure has been well controlled.  I will give her the salty 6 diet sheet, request her labs from Dr. Scharlene Gloss office, and have her follow-up in 1 year.  Today she denies chest pain, shortness of breath, lower extremity edema, fatigue, palpitations, melena, hematuria, hemoptysis, diaphoresis, weakness, presyncope, syncope, orthopnea, and PND.     Home Medications    Prior to Admission medications   Medication Sig Start  Date End Date Taking? Authorizing Provider  acetaminophen (TYLENOL) 500 MG tablet Take 1,000 mg by mouth 2 (two) times daily as needed for moderate pain or headache.    [provider]  amLODipine (NORVASC) 5 MG tablet Take 1 tablet by mouth twice daily 06/19/19   Laqueta Linden, MD  B Complex-C (B-COMPLEX WITH VITAMIN C) tablet Take 1 tablet by mouth daily.    [provider]  Cholecalciferol (VITAMIN D3 ADULT GUMMIES) 1000 units CHEW Chew by mouth.    [provider]  cyclobenzaprine (FLEXERIL) 10 MG  tablet Take 1 tablet (10 mg total) by mouth 3 (three) times daily. 02/24/19   Avegno, Zachery Dakins, FNP  ibuprofen (ADVIL) 800 MG tablet Take 1 tablet (800 mg total) by mouth 3 (three) times daily. Take with food 02/24/19   Avegno, Zachery Dakins, FNP  JANUMET XR [phone] MG TB24 Take 1 tablet by mouth daily. 09/02/18   [provider]  Magnesium Carbonate 250 MG/GM POWD Take by mouth.    [provider]  Multiple Vitamins-Calcium (ONE-A-DAY WOMENS PO) Take 1 tablet by mouth daily.    [provider]  nebivolol (BYSTOLIC) 10 MG tablet Take 1 tablet (10 mg total) by mouth daily. 12/21/19   Strader, Lennart Pall, PA-C  norethindrone (MICRONOR,CAMILA,ERRIN) 0.35 MG tablet Take 1 tablet by mouth daily.    [provider]  olmesartan-hydrochlorothiazide (BENICAR HCT) 40-12.5 MG tablet Take 1 tablet by mouth daily. 12/21/19   Strader, Lennart Pall, PA-C  predniSONE (DELTASONE) 10 MG tablet Take 2 tablets (20 mg total) by mouth daily. 02/24/19   AvegnoZachery Dakins, FNP    Family History    Family History  Problem Relation Age of Onset  . Prostate cancer Father   . Breast cancer Maternal Grandfather   . Anesthesia problems Neg Hx   . Hypotension Neg Hx   . Malignant hyperthermia Neg Hx   . Pseudochol deficiency Neg Hx   . Colon cancer Neg Hx    She indicated that the status of her father is unknown. She indicated that the status of her maternal grandfather is unknown. She indicated that the status of her neg hx is unknown.  Social History    Social History   Socioeconomic History  . Marital status: Married    Spouse name: Not on file  . Number of children: Not on file  . Years of education: Not on file  . Highest education level: Not on file  Occupational History  . Not on file  Tobacco Use  . Smoking status: Never Smoker  . Smokeless tobacco: Never Used  Vaping Use  . Vaping Use: Never used  Substance and Sexual Activity  . Alcohol use: No  . Drug use: No  .  Sexual activity: Yes    Birth control/protection: Pill  Other Topics Concern  . Not on file  Social History Narrative  . Not on file   Social Determinants of Health   Financial Resource Strain:   . Difficulty of Paying Living Expenses: Not on file  Food Insecurity:   . Worried About Programme researcher, broadcasting/film/video in the Last Year: Not on file  . Ran Out of Food in the Last Year: Not on file  Transportation Needs:   . Lack of Transportation (Medical): Not on file  . Lack of Transportation (Non-Medical): Not on file  Physical Activity:   . Days of Exercise per Week: Not on file  . Minutes of Exercise per Session: Not  on file  Stress:   . Feeling of Stress : Not on file  Social Connections:   . Frequency of Communication with Friends and Family: Not on file  . Frequency of Social Gatherings with Friends and Family: Not on file  . Attends Religious Services: Not on file  . Active Member of Clubs or Organizations: Not on file  . Attends Banker Meetings: Not on file  . Marital Status: Not on file  Intimate Partner Violence:   . Fear of Current or Ex-Partner: Not on file  . Emotionally Abused: Not on file  . Physically Abused: Not on file  . Sexually Abused: Not on file     Review of Systems    General:  No chills, fever, night sweats or weight changes.  Cardiovascular:  No chest pain, dyspnea on exertion, edema, orthopnea, palpitations, paroxysmal nocturnal dyspnea. Dermatological: No rash, lesions/masses Respiratory: No cough, dyspnea Urologic: No hematuria, dysuria Abdominal:   No nausea, vomiting, diarrhea, bright red blood per rectum, melena, or hematemesis Neurologic:  No visual changes, wkns, changes in mental status. All other systems reviewed and are otherwise negative except as noted above.  Physical Exam    VS:  BP 126/78   Pulse 78   Ht 5\' 7"  (1.702 m)   Wt 256 lb (116.1 kg)   SpO2 99%   BMI 40.10 kg/m  , BMI Body mass index is 40.1 kg/m. GEN: Well  nourished, well developed, in no acute distress. HEENT: normal. Neck: Supple, no JVD, carotid bruits, or masses. Cardiac: RRR, no murmurs, rubs, or gallops. No clubbing, cyanosis, edema.  Radials/DP/PT 2+ and equal bilaterally.  Respiratory:  Respirations regular and unlabored, clear to auscultation bilaterally. GI: Soft, nontender, nondistended, BS + x 4. MS: no deformity or atrophy. Skin: warm and dry, no rash. Neuro:  Strength and sensation are intact. Psych: Normal affect.  Accessory Clinical Findings    Recent Labs: No results found for requested labs within last 8760 hours.   Recent Lipid Panel No results found for: CHOL, TRIG, HDL, CHOLHDL, VLDL, LDLCALC, LDLDIRECT  ECG personally reviewed by me today-normal sinus rhythm rightward axis deviation 78 bpm- No acute changes  Echocardiogram 05/27/2017 Study Conclusions   - Left ventricle: The cavity size was normal. Wall thickness was  increased in a pattern of mild LVH. Systolic function was normal.  The estimated ejection fraction was in the range of 55% to 60%.  Wall motion was normal; there were no regional wall motion  abnormalities. Left ventricular diastolic function parameters  were normal for the patient&'s age.  - Mitral valve: There was trivial regurgitation.  - Right atrium: Central venous pressure (est): 3 mm Hg.  - Tricuspid valve: There was trivial regurgitation.  - Pulmonary arteries: Systolic pressure could not be accurately  estimated.  - Pericardium, extracardiac: A prominent pericardial fat pad was  present.  Assessment & Plan   1.  Essential hypertension-BP today 126/78.  Much better control at home 120s over 70s Continue amlodipine, nebivolol, olmesartan-hydrochlorothiazide Heart healthy low-sodium diet-salty 6 given Increase physical activity as tolerated Request labs from PCP  Atypical chest pain-MSK related.    Pain was related to C-spine which she is now managing with physical  therapy, massage, and visits to the chiropractor. Continue to monitor.  Disposition: Follow-up with cardiology in 12 months.  07/27/2017. Rucha Wissinger NP-C    01/08/2020, 11:35 AM [facility] [address] Office [phone] Fax [phone]  Notice: This  dictation was prepared with Dragon dictation along with smaller phrase technology. Any transcriptional errors that result from this process are unintentional and may not be corrected upon review.

## 2020-01-08 ENCOUNTER — Ambulatory Visit: Payer: BC Managed Care – PPO | Admitting: General Practice

## 2020-01-08 ENCOUNTER — Encounter: Payer: Self-pay | Admitting: General Practice

## 2020-01-08 ENCOUNTER — Other Ambulatory Visit: Payer: Self-pay

## 2020-01-08 VITALS — BP 126/78 | HR 78 | Ht 67.0 in | Wt 256.0 lb

## 2020-01-08 DIAGNOSIS — I10 Essential (primary) hypertension: Secondary | ICD-10-CM

## 2020-01-08 DIAGNOSIS — R079 Chest pain, unspecified: Secondary | ICD-10-CM

## 2020-01-08 NOTE — Patient Instructions (Signed)
Medication Instructions:  Your physician recommends that you continue on your current medications as directed. Please refer to the Current Medication list given to you today.  *If you need a refill on your cardiac medications before your next appointment, please call your pharmacy*   Lab Work: None today If you have labs (blood work) drawn today and your tests are completely normal, you will receive your results only by:  MyChart Message (if you have MyChart) OR  A paper copy in the mail If you have any lab test that is abnormal or we need to change your treatment, we will call you to review the results.   Testing/Procedures: None today   Follow-Up: At [facility], you and your health needs are our priority.  As part of our continuing mission to provide you with exceptional heart care, we have created designated Provider Care Teams.  These Care Teams include your primary Cardiologist (physician) and Advanced Practice Providers (APPs -  Physician Assistants and Nurse Practitioners) who all work together to provide you with the care you need, when you need it.  We recommend signing up for the patient portal called "MyChart".  Sign up information is provided on this After Visit Summary.  MyChart is used to connect with patients for Virtual Visits (Telemedicine).  Patients are able to view lab/test results, encounter notes, upcoming appointments, etc.  Non-urgent messages can be sent to your provider as well.   To learn more about what you can do with MyChart, go to [email or web address].    Your next appointment:   12 month(s)  The format for your next appointment:   In Person  Provider:   To be determined   Other Instructions None      Thank you for choosing [facility] !

## 2020-04-20 ENCOUNTER — Other Ambulatory Visit: Payer: Self-pay | Admitting: Student

## 2020-07-14 ENCOUNTER — Other Ambulatory Visit: Payer: Self-pay | Admitting: Student

## 2020-07-15 ENCOUNTER — Other Ambulatory Visit: Payer: Self-pay

## 2020-07-15 ENCOUNTER — Ambulatory Visit
Admission: RE | Admit: 2020-07-15 | Discharge: 2020-07-15 | Disposition: A | Discharge status: Home / Self Care | Payer: BC Managed Care – PPO | Source: Ambulatory Visit | Attending: Family Medicine | Admitting: Family Medicine

## 2020-07-15 VITALS — BP 152/94 | HR 94 | Temp 98.4°F | Resp 18

## 2020-07-15 DIAGNOSIS — J3089 Other allergic rhinitis: Secondary | ICD-10-CM

## 2020-07-15 MED ORDER — LEVOCETIRIZINE DIHYDROCHLORIDE 5 MG PO TABS: 5.0000 mg | ORAL_TABLET | Freq: Every evening | ORAL | 0 refills | Status: DC

## 2020-07-15 MED ORDER — FLUTICASONE PROPIONATE 50 MCG/ACT NA SUSP: 2.0000 | Freq: Every day | NASAL | 2 refills | Status: DC

## 2020-07-15 NOTE — ED Triage Notes (Signed)
Pt presents with c/o left ear pain and sore throat that began yesterday  

## 2020-07-15 NOTE — Discharge Instructions (Addendum)
I have sent in xyzal for you to take nightly for allergies  I have also sent in flonase for you to use daily   Follow up with this office or with primary care if symptoms are persisting.  Follow up in the ER for high fever, trouble swallowing, trouble breathing, other concerning symptoms.

## 2020-07-15 NOTE — ED Provider Notes (Signed)
[facility]    CSN: [id number] Arrival date & time: 07/15/20  1146      History   Chief Complaint Chief Complaint  Patient presents with  . Sore Throat  . Otalgia    HPI Emily Colon is a 40 y.o. female.   Reports sore throat and left ear pain since yesterday. Has taken claritin this morning and OTC cough and cold with some relief. Denies sick contacts. Has completed Covid vaccines and booster. Has completed flu vaccine. Denies headache, SOB, chills, body aches, nausea, vomiting, diarrhea, rash, fever, other symptoms.  ROS per HPI  The history is provided by the patient.  Sore Throat  Otalgia   Past Medical History:  Diagnosis Date  . Hypertension   . Kidney stone   . PONV (postoperative nausea and vomiting)     Patient Active Problem List   Diagnosis Date Noted  . Elevated blood pressure reading 07/02/2016  . Elevated LFTs 04/15/2016  . Family history of colonic polyps 04/15/2016  . Rectal bleeding 04/15/2016  . POLYCYSTIC OVARIAN DISEASE 12/20/2006  . CARPAL TUNNEL SYNDROME 12/20/2006    Past Surgical History:  Procedure Laterality Date  . CARPAL TUNNEL RELEASE  2005 and 2006   bilateral    also cubital tunnel  . CHOLECYSTECTOMY  11/07/2010   Procedure: LAPAROSCOPIC CHOLECYSTECTOMY;  Surgeon: Fabio Bering;  Location: [facility];  Service: General;  Laterality: N/A;  . COLONOSCOPY N/A 04/30/2016   Procedure: COLONOSCOPY;  Surgeon: West Bali, MD;  Location: [facility];  Service: Endoscopy;  Laterality: N/A;  9:45am  . URETHRA SURGERY  age 46  . WISDOM TOOTH EXTRACTION  2001    OB History   No obstetric history on file.      Home Medications    Prior to Admission medications   Medication Sig Start Date End Date Taking? Authorizing Provider  fluticasone (FLONASE) 50 MCG/ACT nasal spray Place 2 sprays into both nostrils daily. 07/15/20  Yes Moshe Cipro, NP  levocetirizine (XYZAL) 5 MG tablet Take 1 tablet (5 mg total) by  mouth every evening. 07/15/20  Yes Moshe Cipro, NP  acetaminophen (TYLENOL) 500 MG tablet Take 1,000 mg by mouth 2 (two) times daily as needed for moderate pain or headache.    [provider]  amLODipine (NORVASC) 5 MG tablet amlodipine 5 mg tablet   5 mg every day by oral route.    [provider]  atorvastatin (LIPITOR) 10 MG tablet Take 10 mg by mouth daily. 09/14/19   [provider]  Cholecalciferol (VITAMIN D3 ADULT GUMMIES) 1000 units CHEW Chew by mouth.    [provider]  cyclobenzaprine (FLEXERIL) 10 MG tablet Take 10 mg by mouth as needed for muscle spasms.    [provider]  ibuprofen (ADVIL) 800 MG tablet Take 1 tablet (800 mg total) by mouth 3 (three) times daily. Take with food 02/24/19   Avegno, Zachery Dakins, FNP  JANUMET XR [phone] MG TB24 Take 1 tablet by mouth daily. 09/02/18   [provider]  metFORMIN (GLUCOPHAGE) 500 MG tablet Take 500 mg by mouth 2 (two) times daily. 12/06/19   [provider]  Multiple Vitamins-Calcium (ONE-A-DAY WOMENS PO) Take 1 tablet by mouth daily.    [provider]  nebivolol (BYSTOLIC) 10 MG tablet Take 1 tablet (10 mg total) by mouth daily. 12/21/19   Strader, Lennart Pall, PA-C  olmesartan-hydrochlorothiazide (BENICAR HCT) 40-12.5 MG tablet Take 1 tablet by mouth once daily 04/22/20  Strader, [place] M, PA-C  OZEMPIC, 0.25 OR 0.5 MG/DOSE, 2 MG/1.5ML SOPN Inject into the skin. 12/01/19   [provider]    Family History Family History  Problem Relation Age of Onset  . Prostate cancer Father   . Breast cancer Maternal Grandfather   . Anesthesia problems Neg Hx   . Hypotension Neg Hx   . Malignant hyperthermia Neg Hx   . Pseudochol deficiency Neg Hx   . Colon cancer Neg Hx     Social History Social History   Tobacco Use  . Smoking status: Never Smoker  . Smokeless tobacco: Never Used  Vaping Use  . Vaping Use: Never used  Substance Use Topics  .  Alcohol use: No  . Drug use: No     Allergies   Gluten meal and Other   Review of Systems Review of Systems  HENT: Positive for ear pain.      Physical Exam Triage Vital Signs ED Triage Vitals  Enc Vitals Group     BP 07/15/20 1235 (!) 152/94     Pulse Rate 07/15/20 1235 94     Resp 07/15/20 1235 18     Temp 07/15/20 1235 98.4 F (36.9 C)     Temp src --      SpO2 07/15/20 1235 97 %     Weight --      Height --      Head Circumference --      Peak Flow --      Pain Score 07/15/20 1233 6     Pain Loc --      Pain Edu? --      Excl. in GC? --    No data found.  Updated Vital Signs BP (!) 152/94   Pulse 94   Temp 98.4 F (36.9 C)   Resp 18   SpO2 97%   Visual Acuity Right Eye Distance:   Left Eye Distance:   Bilateral Distance:    Right Eye Near:   Left Eye Near:    Bilateral Near:     Physical Exam Vitals and nursing note reviewed.  Constitutional:      General: She is not in acute distress.    Appearance: She is well-developed. She is obese. She is not ill-appearing.  HENT:     Head: Normocephalic and atraumatic.     Right Ear: Tympanic membrane, ear canal and external ear normal.     Left Ear: Tympanic membrane, ear canal and external ear normal.     Nose: Congestion present.     Mouth/Throat:     Mouth: Mucous membranes are moist.     Pharynx: Posterior oropharyngeal erythema present.     Comments: Cobblestoning present  Eyes:     Extraocular Movements: Extraocular movements intact.     Conjunctiva/sclera: Conjunctivae normal.     Pupils: Pupils are equal, round, and reactive to light.  Cardiovascular:     Rate and Rhythm: Normal rate and regular rhythm.     Heart sounds: Normal heart sounds. No murmur heard.   Pulmonary:     Effort: Pulmonary effort is normal. No respiratory distress.     Breath sounds: Normal breath sounds. No stridor. No wheezing, rhonchi or rales.  Abdominal:     General: Bowel sounds are normal.     Palpations:  Abdomen is soft.     Tenderness: There is no abdominal tenderness.  Musculoskeletal:        General: Normal range of motion.  Cervical back: Normal range of motion and neck supple.  Lymphadenopathy:     Cervical: No cervical adenopathy.  Skin:    General: Skin is warm and dry.     Capillary Refill: Capillary refill takes less than 2 seconds.  Neurological:     General: No focal deficit present.     Mental Status: She is alert and oriented to person, place, and time.  Psychiatric:        Mood and Affect: Mood normal.        Behavior: Behavior normal.        Thought Content: Thought content normal.      UC Treatments / Results  Labs (all labs ordered are listed, but only abnormal results are displayed) Labs Reviewed - No data to display  EKG   Radiology No results found.  Procedures Procedures (including critical care time)  Medications Ordered in UC Medications - No data to display  Initial Impression / Assessment and Plan / UC Course  I have reviewed the triage vital signs and the nursing notes.  Pertinent labs & imaging results that were available during my care of the patient were reviewed by me and considered in my medical decision making (see chart for details).    Allergic Rhinitis  Prescribed xyzal Prescribed flonase May use these for symptomatic relief Follow up with this office or with primary care if symptoms are persisting.  Follow up in the ER for high fever, trouble swallowing, trouble breathing, other concerning symptoms.   Final Clinical Impressions(s) / UC Diagnoses   Final diagnoses:  Seasonal allergic rhinitis due to other allergic trigger     Discharge Instructions     I have sent in xyzal for you to take nightly for allergies  I have also sent in flonase for you to use daily   Follow up with this office or with primary care if symptoms are persisting.  Follow up in the ER for high fever, trouble swallowing, trouble breathing,  other concerning symptoms.     ED Prescriptions    Medication Sig Dispense Auth. Provider   levocetirizine (XYZAL) 5 MG tablet Take 1 tablet (5 mg total) by mouth every evening. 30 tablet Moshe Cipro, NP   fluticasone (FLONASE) 50 MCG/ACT nasal spray Place 2 sprays into both nostrils daily. 9.9 mL Moshe Cipro, NP     PDMP not reviewed this encounter.   Moshe Cipro, NP 07/15/20 1247

## 2020-07-15 NOTE — Telephone Encounter (Signed)
This is a [place] pt.  °

## 2021-02-25 IMAGING — MR MR CERVICAL SPINE W/O CM
4 of 5 series · 22 of 48 positions shown · non-contrast
Comparison: None.

CLINICAL DATA: Left shoulder pain radiating down the left arm.

EXAM:
MRI CERVICAL SPINE WITHOUT CONTRAST
TECHNIQUE: Multiplanar, multisequence MR imaging of the cervical spine was
performed. No intravenous contrast was administered.

[Series 3: T2 · sagittal · 3.0mm · 0.61mm/px · 6 of 15 slices shown (1 of 2)]
[im 1/15]
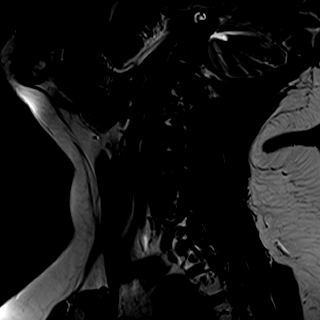
[im 3/15]
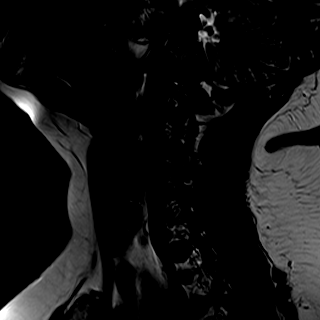
[im 6/15]
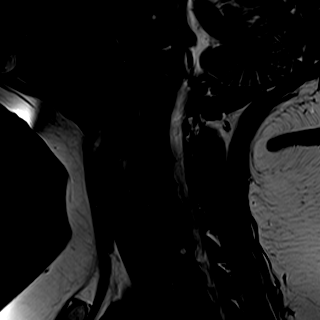
[im 9/15]
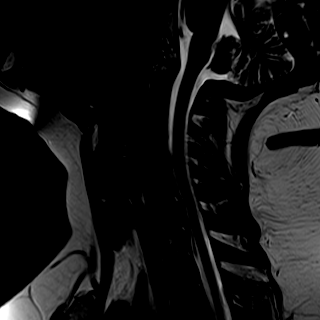
[im 12/15]
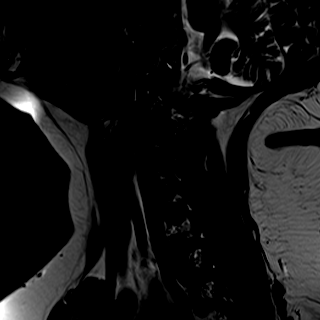
[im 15/15]
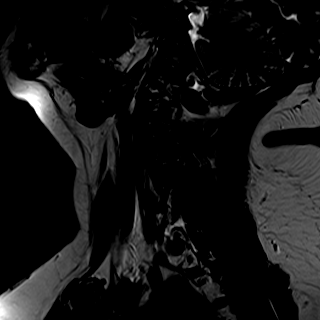

[Series 4: FLAIR · sagittal · 3.0mm · 0.61mm/px · 3 of 15 slices shown]
[im 3/15]
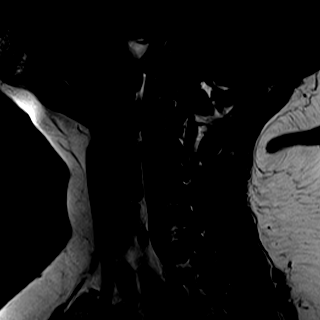
[im 8/15]
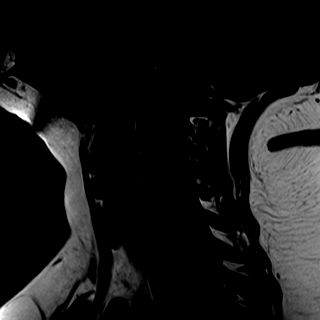
[im 12/15]
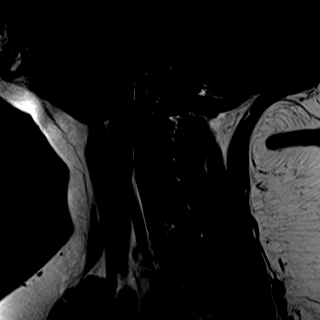

[Series 5: STIR · sagittal · 3.0mm · 0.31mm/px · 5 of 15 slices shown]
[im 1/15]
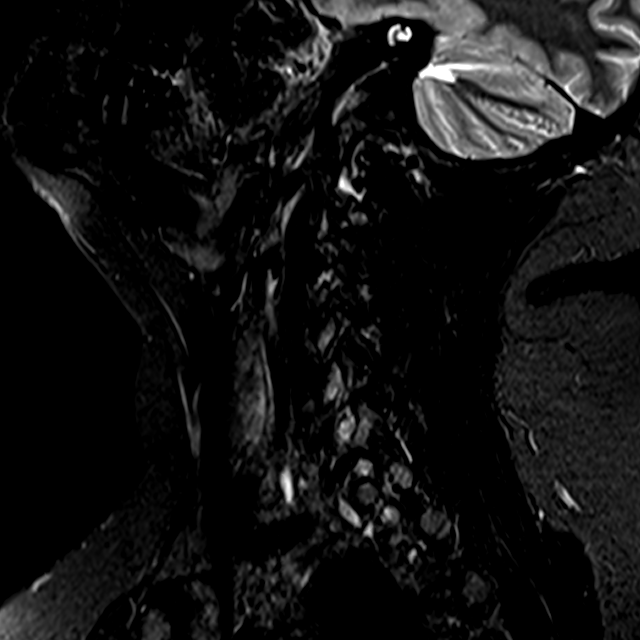
[im 3/15]
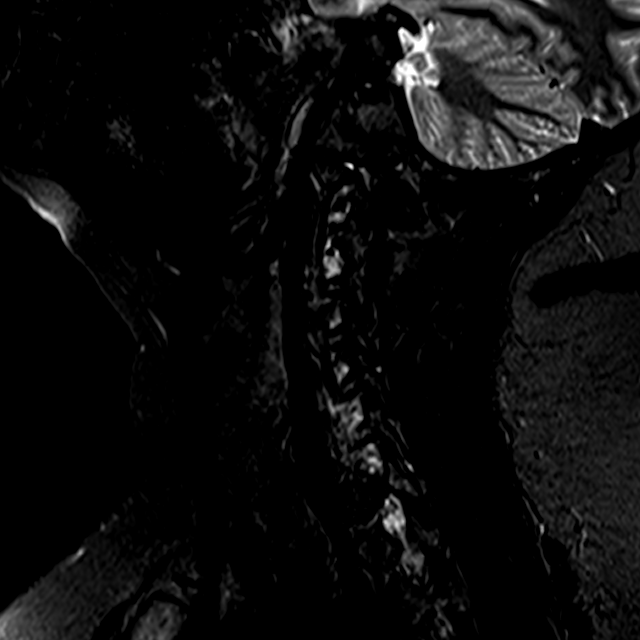
[im 5/15]
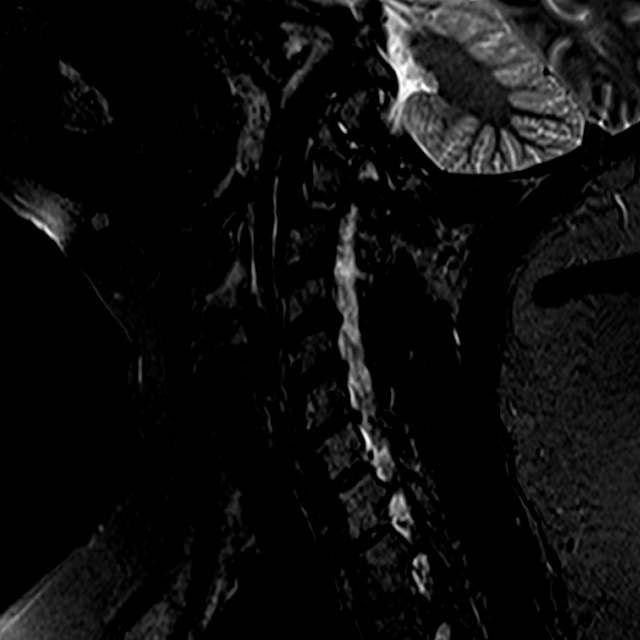
[im 8/15]
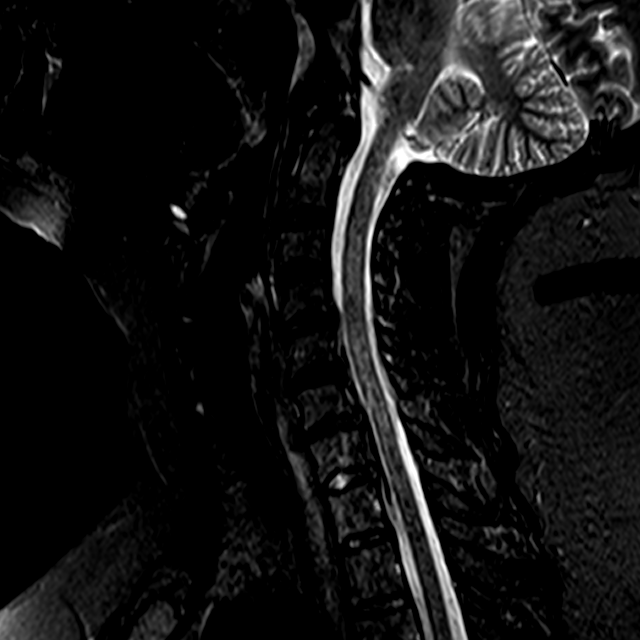
[im 12/15]
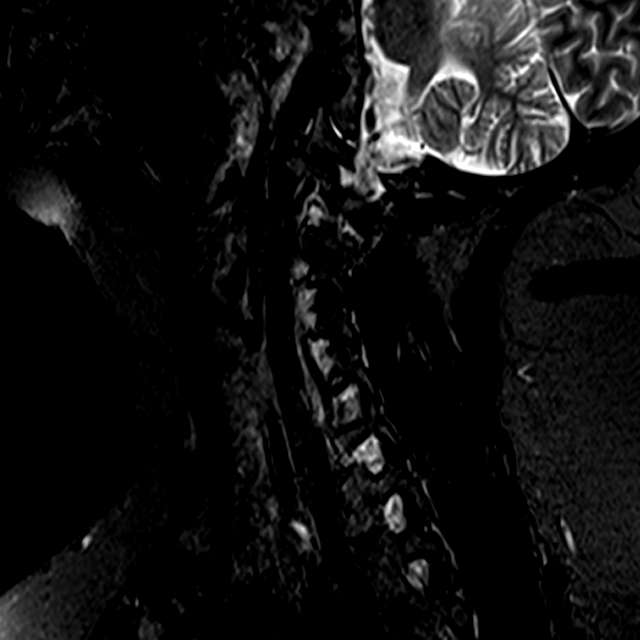

[Series 6: T2 · axial · 3.0mm · 0.21mm/px · z∈[-82,+10]mm · 8 of 30 slices shown (2 of 2)]
[im 1/30]
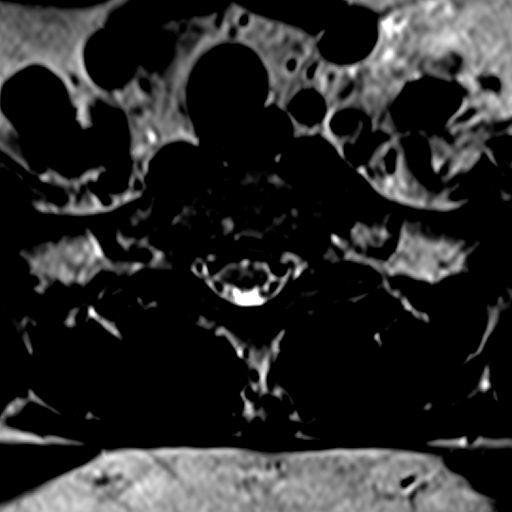
[im 5/30]
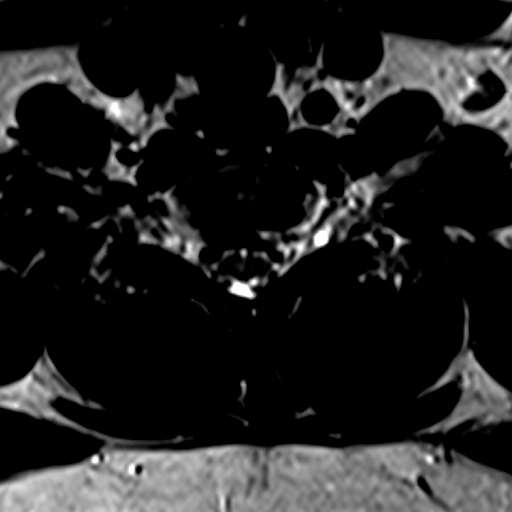
[im 9/30]
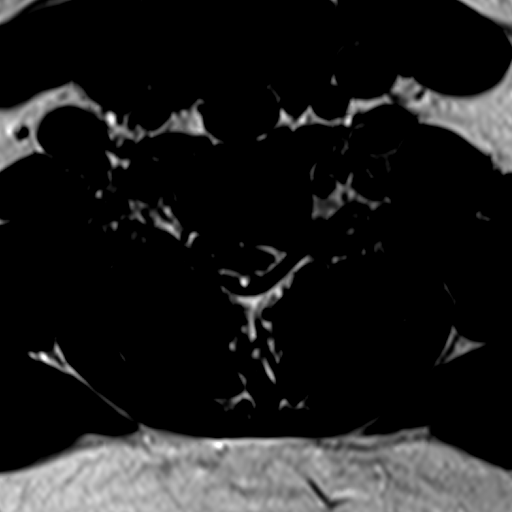
[im 14/30]
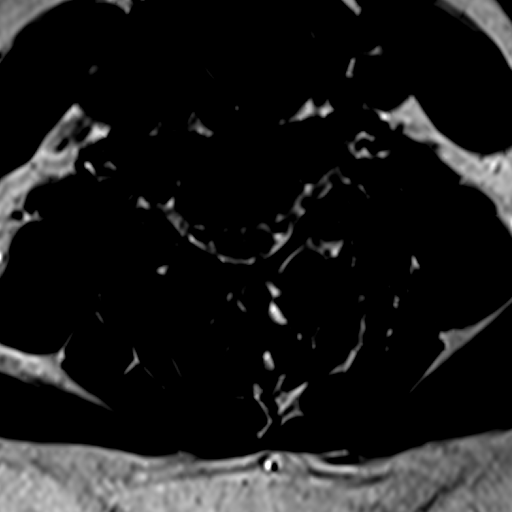
[im 16/30]
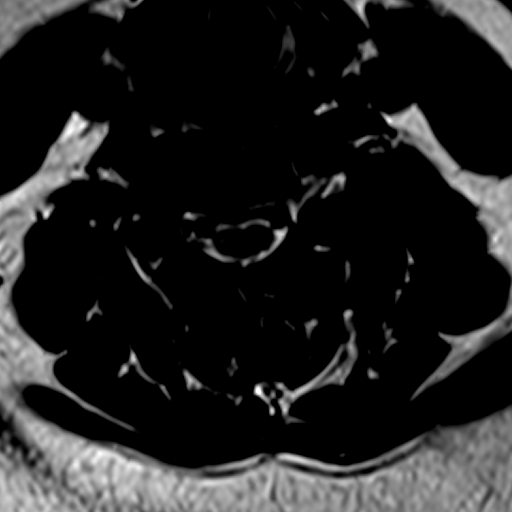
[im 21/30]
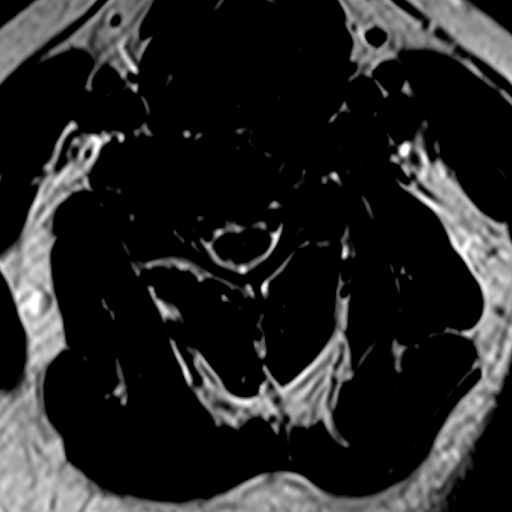
[im 25/30]
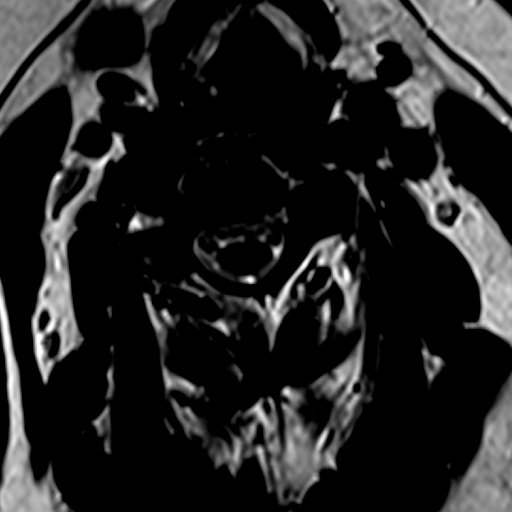
[im 30/30]
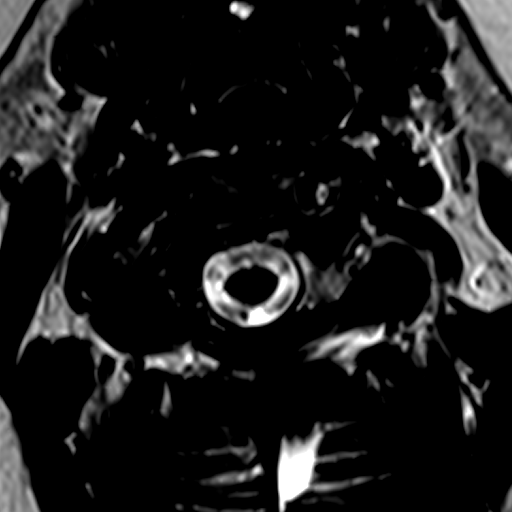

[22 of 48 positions shown; findings below may reference images not displayed]

FINDINGS: Alignment: Physiologic.

Vertebrae: No fracture, evidence of discitis, or bone lesion.

Cord: Normal signal and morphology.

Posterior Fossa, vertebral arteries, paraspinal tissues: Posterior
fossa demonstrates no focal abnormality. Vertebral artery flow voids
are maintained. Paraspinal soft tissues are unremarkable.

Disc levels:

Discs: Mild degenerative disease with disc height loss at C6-7.

C2-3: No significant disc bulge. No neural foraminal stenosis. No
central canal stenosis.

C3-4: No significant disc bulge. Left uncovertebral degenerative
changes and mild left facet arthropathy. Severe left foraminal
stenosis. No central canal stenosis.

C4-5: Broad-based disc bulge with a small central disc protrusion
impressing on the thecal sac. No neural foraminal stenosis. No
central canal stenosis.

C5-6: Broad-based disc bulge. Mild right foraminal narrowing. No
left foraminal narrowing. No central canal stenosis.

C6-7: Broad-based disc bulge with a large left foraminal disc
protrusion. Moderate left foraminal stenosis. No central canal
stenosis.

C7-T1: Small central disc protrusion. No neural foraminal stenosis.
No central canal stenosis.
IMPRESSION: 1. Diffuse cervical spine spondylosis as described above.
2.  No acute osseous injury of the cervical spine.

## 2021-03-25 NOTE — Progress Notes (Signed)
Cardiology Clinic Note   Patient Name: Emily Colon Date of Encounter: 04/02/2021  Primary Care Provider:  Celene Squibb, MD Primary Cardiologist:  Kate Sable, MD (Inactive)  Patient Profile    Emily Colon 41 year old female presents to the clinic for follow-up evaluation of her essential hypertension.  Past Medical History    Past Medical History:  Diagnosis Date   Hypertension    Kidney stone    PONV (postoperative nausea and vomiting)    Past Surgical History:  Procedure Laterality Date   CARPAL TUNNEL RELEASE  2005 and 2006   bilateral    also cubital tunnel   CHOLECYSTECTOMY  11/07/2010   Procedure: LAPAROSCOPIC CHOLECYSTECTOMY;  Surgeon: Donato Heinz;  Location: [facility];  Service: General;  Laterality: N/A;   COLONOSCOPY N/A 04/30/2016   Procedure: COLONOSCOPY;  Surgeon: Danie Binder, MD;  Location: [facility];  Service: Endoscopy;  Laterality: N/A;  9:45am   URETHRA SURGERY  age 79   WISDOM TOOTH EXTRACTION  2001    Allergies  Allergies  Allergen Reactions   Gluten Meal Swelling    Joint pain, lethargy    Other Nausea And Vomiting    Quinoa    History of Present Illness    Emily Colon has a PMH of  DM-2 ,  HLD, elevated LFTs, essential hypertension, carpal tunnel syndrome, polycystic ovary disease, and rectal bleeding.  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 She has had atypical chest pain with cervical spine issues   She is new to me seeing Dr Bronson Ing in 2019-20 and Renaee Munda 01/08/20  She has been on bystolic, Benicar/Hctz and norvasc for her BP However her norvasc and bysotlic ran out and she did not refill. BP up today She has not been checking it at home Having some palpitations infrequent and less than a minute    Home Medications    Prior to Admission medications   Medication Sig Start Date End Date  Taking? Authorizing Provider  acetaminophen (TYLENOL) 500 MG tablet Take 1,000 mg by mouth 2 (two) times daily as needed for moderate pain or headache.    [provider]  amLODipine (NORVASC) 5 MG tablet Take 1 tablet by mouth twice daily 06/19/19   Herminio Commons, MD  B Complex-C (B-COMPLEX WITH VITAMIN C) tablet Take 1 tablet by mouth daily.    [provider]  Cholecalciferol (VITAMIN D3 ADULT GUMMIES) 1000 units CHEW Chew by mouth.    [provider]  cyclobenzaprine (FLEXERIL) 10 MG tablet Take 1 tablet (10 mg total) by mouth 3 (three) times daily. 02/24/19   Avegno, Darrelyn Hillock, FNP  ibuprofen (ADVIL) 800 MG tablet Take 1 tablet (800 mg total) by mouth 3 (three) times daily. Take with food 02/24/19   Avegno, Darrelyn Hillock, FNP  JANUMET XR [phone] MG TB24 Take 1 tablet by mouth daily. 09/02/18   [provider]  Magnesium Carbonate 250 MG/GM POWD Take by mouth.    [provider]  Multiple Vitamins-Calcium (ONE-A-DAY WOMENS PO) Take 1 tablet by mouth daily.    [provider]  nebivolol (BYSTOLIC) 10 MG tablet Take 1 tablet (10 mg total) by mouth daily. 12/21/19   Strader, Fransisco Hertz, PA-C  norethindrone (MICRONOR,CAMILA,ERRIN) 0.35 MG tablet Take 1 tablet by mouth daily.    [provider]  olmesartan-hydrochlorothiazide (BENICAR HCT) 40-12.5 MG tablet Take 1 tablet by mouth  daily. 12/21/19   Strader, Fransisco Hertz, PA-C  predniSONE (DELTASONE) 10 MG tablet Take 2 tablets (20 mg total) by mouth daily. 02/24/19   AvegnoDarrelyn Hillock, FNP    Family History    Family History  Problem Relation Age of Onset   Prostate cancer Father    Breast cancer Maternal Grandfather    Anesthesia problems Neg Hx    Hypotension Neg Hx    Malignant hyperthermia Neg Hx    Pseudochol deficiency Neg Hx    Colon cancer Neg Hx    She indicated that the status of her father is unknown. She indicated that the status of her maternal grandfather is unknown.  She indicated that the status of her neg hx is unknown.  Social History    Social History   Socioeconomic History   Marital status: Married    Spouse name: Not on file   Number of children: Not on file   Years of education: Not on file   Highest education level: Not on file  Occupational History   Not on file  Tobacco Use   Smoking status: Never   Smokeless tobacco: Never  Vaping Use   Vaping Use: Never used  Substance and Sexual Activity   Alcohol use: No   Drug use: No   Sexual activity: Yes    Birth control/protection: Pill  Other Topics Concern   Not on file  Social History Narrative   Not on file   Social Determinants of Health   Financial Resource Strain: Not on file  Food Insecurity: Not on file  Transportation Needs: Not on file  Physical Activity: Not on file  Stress: Not on file  Social Connections: Not on file  Intimate Partner Violence: Not on file     Review of Systems    General:  No chills, fever, night sweats or weight changes.  Cardiovascular:  No chest pain, dyspnea on exertion, edema, orthopnea, palpitations, paroxysmal nocturnal dyspnea. Dermatological: No rash, lesions/masses Respiratory: No cough, dyspnea Urologic: No hematuria, dysuria Abdominal:   No nausea, vomiting, diarrhea, bright red blood per rectum, melena, or hematemesis Neurologic:  No visual changes, wkns, changes in mental status. All other systems reviewed and are otherwise negative except as noted above.  Physical Exam    VS:  BP (!) 146/90    Pulse 80    Ht 5\' 7"  (1.702 m)    Wt 251 lb (113.9 kg)    SpO2 98%    BMI 39.31 kg/m  , BMI Body mass index is 39.31 kg/m.  Affect appropriate Healthy:  appears stated age 40: normal Neck supple with no adenopathy JVP normal no bruits no thyromegaly Lungs clear with no wheezing and good diaphragmatic motion Heart:  S1/S2 no murmur, no rub, gallop or click PMI normal Abdomen: benighn, BS positve, no tenderness, no AAA no  bruit.  No HSM or HJR Distal pulses intact with no bruits No edema Neuro non-focal Skin warm and dry No muscular weakness   Accessory Clinical Findings    Recent Labs: No results found for requested labs within last 8760 hours.   Recent Lipid Panel No results found for: CHOL, TRIG, HDL, CHOLHDL, VLDL, LDLCALC, LDLDIRECT  ECG personally reviewed by me today-normal sinus rhythm rightward axis deviation 78 bpm- No acute changes  Echocardiogram 05/27/2017 Study Conclusions   - Left ventricle: The cavity size was normal. Wall thickness was    increased in a pattern of mild LVH. Systolic function was normal.  The estimated ejection fraction was in the range of 55% to 60%.    Wall motion was normal; there were no regional wall motion    abnormalities. Left ventricular diastolic function parameters    were normal for the patient&'s age.  - Mitral valve: There was trivial regurgitation.  - Right atrium: Central venous pressure (est): 3 mm Hg.  - Tricuspid valve: There was trivial regurgitation.  - Pulmonary arteries: Systolic pressure could not be accurately    estimated.  - Pericardium, extracardiac: A prominent pericardial fat pad was    present.  Assessment & Plan   1.  Essential hypertension- Restart norvasc f/u with primary   2. Atypical chest pain-MSK related.   Cervical spine issues Discussed utility of calcium score to further risk stratify given HLD, HTN and DM-2   3. DM:  Discussed low carb diet.  Target hemoglobin A1c is 6.5 or less.  Continue current medications.  4. HLD:  on lipitor labs with primary goal LDL < 70 adjust dose based on calcium score   5. CAD:  risk calcium score ordered   6. Palpitations:  benign sounding no need for monitor Discussed Jackelyn Poling mobile device for her to purchase    Calcium score Norvasc 5 mg   F/U in  a year

## 2021-04-02 ENCOUNTER — Encounter: Payer: Self-pay | Admitting: Cardiovascular Disease

## 2021-04-02 ENCOUNTER — Ambulatory Visit: Payer: BC Managed Care – PPO | Admitting: Cardiovascular Disease

## 2021-04-02 ENCOUNTER — Other Ambulatory Visit: Payer: Self-pay

## 2021-04-02 ENCOUNTER — Encounter: Payer: Self-pay | Admitting: *Deleted

## 2021-04-02 VITALS — BP 146/90 | HR 80 | Ht 67.0 in | Wt 251.0 lb

## 2021-04-02 DIAGNOSIS — E782 Mixed hyperlipidemia: Secondary | ICD-10-CM

## 2021-04-02 DIAGNOSIS — I10 Essential (primary) hypertension: Secondary | ICD-10-CM | POA: Diagnosis not present

## 2021-04-02 DIAGNOSIS — R079 Chest pain, unspecified: Secondary | ICD-10-CM | POA: Diagnosis not present

## 2021-04-02 MED ORDER — AMLODIPINE BESYLATE 5 MG PO TABS: 5.0000 mg | ORAL_TABLET | Freq: Every day | ORAL | 3 refills | Status: DC

## 2021-04-02 NOTE — Patient Instructions (Signed)
Medication Instructions:   Start Norvasc 5 mg Daily   *If you need a refill on your cardiac medications before your next appointment, please call your pharmacy*   Lab Work: NONE   If you have labs (blood work) drawn today and your tests are completely normal, you will receive your results only by: MyChart Message (if you have MyChart) OR A paper copy in the mail If you have any lab test that is abnormal or we need to change your treatment, we will call you to review the results.   Testing/Procedures: Calcium Score Test    Follow-Up: At [facility], you and your health needs are our priority.  As part of our continuing mission to provide you with exceptional heart care, we have created designated Provider Care Teams.  These Care Teams include your primary Cardiologist (physician) and Advanced Practice Providers (APPs -  Physician Assistants and Nurse Practitioners) who all work together to provide you with the care you need, when you need it.  We recommend signing up for the patient portal called "MyChart".  Sign up information is provided on this After Visit Summary.  MyChart is used to connect with patients for Virtual Visits (Telemedicine).  Patients are able to view lab/test results, encounter notes, upcoming appointments, etc.  Non-urgent messages can be sent to your provider as well.   To learn more about what you can do with MyChart, go to [email or web address].    Your next appointment:   1 year(s)  The format for your next appointment:   In Person  Provider:   Charlton Haws, MD    Other Instructions Thank you for choosing [facility]!

## 2021-04-11 ENCOUNTER — Telehealth: Payer: Self-pay

## 2021-04-11 DIAGNOSIS — I10 Essential (primary) hypertension: Secondary | ICD-10-CM

## 2021-04-11 DIAGNOSIS — E782 Mixed hyperlipidemia: Secondary | ICD-10-CM

## 2021-04-11 NOTE — Telephone Encounter (Signed)
Left message for patient to call back  

## 2021-04-11 NOTE — Telephone Encounter (Signed)
-----   Message from Josue Hector, MD sent at 04/10/2021 10:05 AM EST ----- Keep trying the lipitor refer to lipid clinic  ----- Message ----- From: Michaelyn Barter, RN Sent: 04/10/2021   9:36 AM EST To: Josue Hector, MD  Patient stated that she is trying to take Lipitor 10 mg every other day. She stated she is having a hard time tolerating the medication. Patient stated she has not tried any other cholesterol medications. Will send to Dr. Johnsie Cancel for further advisement.

## 2021-05-06 ENCOUNTER — Ambulatory Visit: Payer: BC Managed Care – PPO

## 2021-05-08 ENCOUNTER — Other Ambulatory Visit: Payer: Self-pay

## 2021-05-08 ENCOUNTER — Ambulatory Visit: Payer: BC Managed Care – PPO | Admitting: Pharmacist

## 2021-05-08 DIAGNOSIS — I10 Essential (primary) hypertension: Secondary | ICD-10-CM | POA: Diagnosis not present

## 2021-05-08 DIAGNOSIS — E782 Mixed hyperlipidemia: Secondary | ICD-10-CM | POA: Diagnosis not present

## 2021-05-08 MED ORDER — AMLODIPINE BESYLATE 10 MG PO TABS: 10.0000 mg | ORAL_TABLET | Freq: Every day | ORAL | 3 refills | Status: DC

## 2021-05-08 MED ORDER — ROSUVASTATIN CALCIUM 10 MG PO TABS: 10.0000 mg | ORAL_TABLET | Freq: Every day | ORAL | 3 refills | Status: DC

## 2021-05-08 NOTE — Patient Instructions (Addendum)
Blood pressure: ?Your blood pressure goal is < 130/42mmHg ? ?Increase your amlodipine from 5mg  to 10mg  daily ? ?Continue taking your olmesartan-HCTZ ? ?Monitor your blood pressure at home and record your readings. Bring these in along with your home cuff to your next visit ? ?Limit daily sodium intake to < 2,000mg  daily ? ?Aim for 150 minutes of activity a week ? ?Cholesterol: ?Stop taking atorvastatin - monitor for improvement in muscle cramps. Then, start rosuvastatin 10mg  - 1 tablet daily ? ?Follow up with your PCP as scheduled. I'll give you a call after to review ?

## 2021-05-08 NOTE — Progress Notes (Addendum)
Patient ID: Emily Colon                 DOB: 12/20/1980                      MRN: [id number]     HPI: CLARICE ZULAUF is a 41 y.o. female referred by Dr. Eden Emms to PharmD clinic for HTN and HLD management. PMH is significant for HTN, DM2, HLD, elevated LFTs, PCOS, carpal tunnel syndrome. Last seen by Dr Eden Emms 04/02/21 where BP was elevated at 146/90 secondary to pt running out of amlodipine and Bystolic and not refilling. She was restarted on amlodipine and also scheduled for a calcium score given episodes of atypical chest pain.  Pt presents today for follow up. Has still been trying to take her atorvastatin but experienced cramping even on every other day dosing. Cramping primarily in her arms, legs, and feet. Has not tried other cholesterol medications in the past.  Resumed her amlodipine, has still been off of Bystolic - med list updated. No clear indication for beta blocker therapy, was just being used for HTN. Has a BP cuff at home but hasn't been checking recently. Denies dizziness, headache, and LE edema. Hopes to decrease meds in the future if possible.  Current HTN meds: amlodipine 5mg  daily (AM), olmesartan-HCTZ 40-12.5mg  daily (AM) BP goal: <130/79mmHg  Current lipid meds: atorvastatin 10mg  daily - muscle cramping LDL goal: < 100mg /dL  Family History: Father with prostate cancer, maternal grandfather with breast cancer.  Social History: Denies tobacco, alcohol and drug use.  Diet: Gluten free. Cut back on caffeine, doesn't add salt to food. Lost 30 lbs  Labs: 01/15/21: A1c 6.8%, SCr 0.79, Na 137, K 4.2, TC 245, TG 157, HDL 51, LDL 165  Wt Readings from Last 3 Encounters:  04/02/21 251 lb (113.9 kg)  01/08/20 256 lb (116.1 kg)  09/26/18 269 lb (122 kg)   BP Readings from Last 3 Encounters:  04/02/21 (!) 146/90  07/15/20 (!) 152/94  01/08/20 126/78   Pulse Readings from Last 3 Encounters:  04/02/21 80  07/15/20 94  01/08/20 78    Renal function: CrCl cannot  be calculated (Patient's most recent lab result is older than the maximum 21 days allowed.).  Past Medical History:  Diagnosis Date   Hypertension    Kidney stone    PONV (postoperative nausea and vomiting)     Current Outpatient Medications on File Prior to Visit  Medication Sig Dispense Refill   acetaminophen (TYLENOL) 500 MG tablet Take 1,000 mg by mouth 2 (two) times daily as needed for moderate pain or headache.     amLODipine (NORVASC) 5 MG tablet Take 1 tablet (5 mg total) by mouth daily. 90 tablet 3   atorvastatin (LIPITOR) 10 MG tablet Take 10 mg by mouth daily.     BYSTOLIC 10 MG tablet Take 1 tablet by mouth once daily 90 tablet 0   Cholecalciferol 25 MCG (1000 UT) CHEW Chew by mouth.     cyclobenzaprine (FLEXERIL) 10 MG tablet Take 10 mg by mouth as needed for muscle spasms.     fluticasone (FLONASE) 50 MCG/ACT nasal spray Place 2 sprays into both nostrils daily. 9.9 mL 2   ibuprofen (ADVIL) 800 MG tablet Take 1 tablet (800 mg total) by mouth 3 (three) times daily. Take with food 21 tablet 0   JANUMET XR [phone] MG TB24 Take 1 tablet by mouth daily.     levocetirizine (XYZAL) 5 MG  tablet Take 1 tablet (5 mg total) by mouth every evening. 30 tablet 0   levonorgestrel (MIRENA, 52 MG,) 20 MCG/DAY IUD Mirena 21 mcg/24 hours (8 yrs) 52 mg intrauterine device  Take 1 device by intrauterine route.     metFORMIN (GLUCOPHAGE) 500 MG tablet Take 500 mg by mouth 2 (two) times daily.     Multiple Vitamins-Calcium (ONE-A-DAY WOMENS PO) Take 1 tablet by mouth daily.     olmesartan-hydrochlorothiazide (BENICAR HCT) 40-12.5 MG tablet Take 1 tablet by mouth once daily 90 tablet 3   OZEMPIC, 0.25 OR 0.5 MG/DOSE, 2 MG/1.5ML SOPN Inject 1 mg into the skin once a week.     No current facility-administered medications on file prior to visit.    Allergies  Allergen Reactions   Gluten Meal Swelling    Joint pain, lethargy    Other Nausea And Vomiting    Quinoa     Assessment/Plan:  1.  Hypertension - BP above goal < 130/69mmHg today. Will increase amlodipine from 5mg  to 10mg  daily and continue olmesartan-HCTZ 40-12.5mg  daily. Pt will aim for < 2,000mg  of daily sodium and 150 mins of weekly activity. She will start monitoring BP at home and bring in log and BP cuff to her PCP visit in a few weeks.  2. Hyperlipidemia - Baseline LDL 165. Has upcoming calcium score on 3/22 which will help dictate LDL goal. DM is currently main risk factor for CAD. Not tolerating atorvastatin 10mg  daily or QOD due to muscle cramping. Will stop and replace with rosuvastatin 10mg  daily. Pt aware to cut her tablets in half if she notices cramping on 10mg  dosing.  Sees her PCP on April 6th. I'll call her after this visit to discuss if any follow up is needed with PharmD.  Sylvester Minton E. Yury Schaus, PharmD, BCACP, CPP [facility] [address] [place],  Phone: [phone]; Fax: [phone] 05/08/2021 3:37 PM

## 2021-05-14 ENCOUNTER — Ambulatory Visit (HOSPITAL_COMMUNITY)
Admission: RE | Admit: 2021-05-14 | Discharge: 2021-05-14 | Disposition: A | Discharge status: Home / Self Care | Payer: Self-pay | Source: Ambulatory Visit | Attending: Cardiovascular Disease | Admitting: Cardiovascular Disease

## 2021-05-14 ENCOUNTER — Other Ambulatory Visit: Payer: Self-pay

## 2021-05-14 DIAGNOSIS — I10 Essential (primary) hypertension: Secondary | ICD-10-CM | POA: Insufficient documentation

## 2021-06-02 ENCOUNTER — Telehealth: Payer: Self-pay | Admitting: Pharmacist

## 2021-06-02 NOTE — Telephone Encounter (Signed)
Called pt to follow up with BP and lipids. I saw her on 05/08/21 and increased her amlodipine from 5mg  to 10mg  daily and replaced her atorvastatin with rosuvastatin to see if she would tolerate it better. ? ?Reports BP was 140/80 but she was stressed that day. Hasn't checked at home at all. Advised her to start monitoring and recording readings. I'll call in a month to follow up. ? ?Also experiencing muscle cramping on rosuvastatin similar to what she experienced on atorvastatin. Her PCP recommended she start CoQ10 which she hasn't done yet but will. Will follow up with tolerability in a month as well. Can decrease dose of rosuvastatin if needed. ?

## 2021-07-01 ENCOUNTER — Telehealth: Payer: Self-pay | Admitting: Pharmacist

## 2021-07-01 ENCOUNTER — Other Ambulatory Visit: Payer: Self-pay | Admitting: Student

## 2021-07-01 NOTE — Telephone Encounter (Signed)
Called pt and left message.  ? ?Following up with rosuvastatin tolerability after her PCP advised her to start CoQ10. Can decrease dose from 10mg  to 5mg  if needed. ? ?Also following up with BP. Pt was advised to start monitoring BP at home after increasing her amlodipine dose. If BP remains above goal < 130/38mmHg, can increase olmesartan-HCTZ from 40-12.5mg  to 40-25mg  daily. Would need f/u BP check and BMET in 3-4 weeks. ?

## 2021-07-04 NOTE — Telephone Encounter (Signed)
2nd message left for pt.

## 2021-07-09 ENCOUNTER — Encounter: Payer: Self-pay | Admitting: Pharmacist

## 2021-10-24 ENCOUNTER — Encounter: Payer: Self-pay | Admitting: Podiatry

## 2021-10-24 ENCOUNTER — Ambulatory Visit: Payer: BC Managed Care – PPO | Admitting: Podiatry

## 2021-10-24 DIAGNOSIS — L600 Ingrowing nail: Secondary | ICD-10-CM | POA: Diagnosis not present

## 2021-10-24 NOTE — Progress Notes (Unsigned)
  Subjective:  Patient ID: Emily Colon, female    DOB: [birth year],  MRN: [id number]  Chief Complaint  Patient presents with   Ingrown Toenail    HX of ingrown tonails on bilateral feet, right foot, hallux is the concern today     41 y.o. female presents with the above complaint. History confirmed with patient. Patient presents for painful nail borders of the left great toe. Has had Right hallux nail procedure for lateral border ingrown in the past. Not having significant issues on the right great toe at this time. Pain on the left great toe esp lateral border primarily. No issues with L medial border. No drainage from nail border. Has not tried any treatment yet. Painful with pressure in shoes on the outside of the great toe.  Objective:  Physical Exam: warm, good capillary refill, nail exam ingrown nail at Left hallux LATERAL border, no trophic changes or ulcerative lesions. DP pulses palpable, PT pulses palpable, and protective sensation intact Left Foot: normal exam, no swelling, tenderness, instability; ligaments intact, full range of motion of all ankle/foot joints  Right Foot: normal exam, no swelling, tenderness, instability; ligaments intact, full range of motion of all ankle/foot joints and Left hallux painful to palpation along the lateral border.    No images are attached to the encounter.  Assessment:   1. Ingrown nail of great toe of left foot      Plan:  Patient was evaluated and treated and all questions answered.  Ingrown Nail   Ingrown Nail, left -Patient elects to proceed with minor surgery to remove ingrown toenail today. Consent reviewed and signed by patient. -Ingrown nail excised. See procedure note. -Educated on post-procedure care including soaking. Written instructions provided and reviewed. -Patient to follow up in 2 weeks for nail check.  Procedure: Excision of Ingrown Toenail Location: Left 1st toe  BILATERAL  nail borders. Anesthesia: Lidocaine  1% plain; 1.5 mL and Marcaine 0.5% plain; 1.5 mL, digital block. Skin Prep: Betadine. Dressing: Silvadene; telfa; dry, sterile, compression dressing. Technique: Following skin prep, the toe was exsanguinated and a tourniquet was secured at the base of the toe. The affected nail border was freed, split with a nail splitter, and excised. Chemical matrixectomy was then performed with phenol and irrigated out with alcohol. The tourniquet was then removed and sterile dressing applied. Disposition: Patient tolerated procedure well. Patient to return in 2 weeks for follow-up.    Return in about 2 weeks (around 11/07/2021) for ingrown nail check. Left great toe bilateral border P&A

## 2021-10-25 ENCOUNTER — Encounter: Payer: Self-pay | Admitting: Podiatry

## 2021-11-05 ENCOUNTER — Other Ambulatory Visit: Payer: Self-pay | Admitting: Cardiovascular Disease

## 2021-11-13 ENCOUNTER — Ambulatory Visit: Payer: BC Managed Care – PPO | Admitting: Podiatry

## 2021-11-13 DIAGNOSIS — L600 Ingrowing nail: Secondary | ICD-10-CM | POA: Diagnosis not present

## 2021-11-13 NOTE — Progress Notes (Signed)
Subjective: Emily Colon is a 41 y.o.  female returns to office today for follow up evaluation after having left Hallux bilateral border nail ingrown removal with phenol and alcohol matrixectomy approximately 2 weeks ago. Patient has been soaking using epsom salts and applying topical antibiotic covered with bandaid daily. Patient denies fevers, chills, nausea, vomiting. Denies any calf pain, chest pain, SOB. States pain is improving.   Objective:  Vitals: Reviewed  General: Well developed, nourished, in no acute distress, alert and oriented x3   Dermatology: Skin is warm, dry and supple bilateral. Left hallux nail border appears to be clean, dry, with mild granular tissue and surrounding scab. There is no surrounding erythema, edema, drainage/purulence. The remaining nails appear unremarkable at this time. There are no other lesions or other signs of infection present.  Neurovascular status: Intact. No lower extremity swelling; No pain with calf compression bilateral.  Musculoskeletal: Decreased tenderness to palpation of the bilateral left hallux nail fold(s). Muscular strength within normal limits bilateral.   Assesement and Plan: S/p phenol and alcohol matrixectomy to the  left hallux nail bilateral borders, doing well.   -Continue soaking in epsom salts twice a day followed by antibiotic ointment and a band-aid. Can leave uncovered at night. Continue this until completely healed.  -If the area has not healed in 2 weeks, call the office for follow-up appointment, or sooner if any problems arise.  -Monitor for any signs/symptoms of infection. Call the office immediately if any occur or go directly to the emergency room. Call with any questions/concerns.        Everitt Amber, [place] [facility]                   11/14/2021

## 2022-01-07 ENCOUNTER — Telehealth: Payer: Self-pay | Admitting: Cardiovascular Disease

## 2022-01-07 NOTE — Telephone Encounter (Signed)
Pt c/o of Chest Pain: STAT if CP now or developed within 24 hours  1. Are you having CP right now? yes  2. Are you experiencing any other symptoms (ex. SOB, nausea, vomiting, sweating)? No   3. How long have you been experiencing CP? Couples weeks   4. Is your CP continuous or coming and going? Coming and going   5. Have you taken Nitroglycerin? No    She states its more on the left side and sometimes on the right. She states she also has some acid reflux and sometimes it hurts physically.  ?

## 2022-01-07 NOTE — Telephone Encounter (Signed)
Spoke to Emily Colon who stated that for the last couple of weeks she has been experiencing a dull ache in the center to left side of her chest. Emily Colon stated that she does not think she is having a heart attack- she thinks it could be more related to acid reflux- but wanted to be sure. Emily Colon stated that she had a unexpected death in the family yesterday and she also believes this could be anxiety/grief related. Emily Colon denies SOB, but has some nausea. Emily Colon stated that pain usually comes/goes, but today it is continuous and "just hurts". Emily Colon has been scheduled with C. Lorna Few in [place] on 11/17 @ 1:30 pm.   Please advise.

## 2022-01-09 ENCOUNTER — Ambulatory Visit: Payer: BC Managed Care – PPO | Attending: Medical | Admitting: Medical

## 2022-01-09 ENCOUNTER — Encounter: Payer: Self-pay | Admitting: Medical

## 2022-01-09 VITALS — BP 138/94 | HR 77 | Ht 67.0 in | Wt 250.0 lb

## 2022-01-09 DIAGNOSIS — R079 Chest pain, unspecified: Secondary | ICD-10-CM | POA: Diagnosis not present

## 2022-01-09 DIAGNOSIS — I10 Essential (primary) hypertension: Secondary | ICD-10-CM

## 2022-01-09 DIAGNOSIS — E782 Mixed hyperlipidemia: Secondary | ICD-10-CM

## 2022-01-09 NOTE — Progress Notes (Signed)
Cardiology Office Note:    Date:  01/09/2022   ID:  Emily Colon, DOB 08-Nov-1980, MRN [id number]  PCP:  Emily Stabile, MD  [facility] Cardiologist:  Emily Docker, MD (Inactive)  [facility] Electrophysiologist:  None   Referring MD: Emily Stabile, MD   Chief Complaint: Chest pain  History of Present Illness:    Emily Colon is a 41 y.o. female with a hx of elevated LFTs, hypertension, carpal tunnel syndrome, polycystic ovary disease, rectal bleeding who presents for chest pain.  Echo 05/2017 showed LVEF 55 to 60%, mild LVH, normal wall diastolic function, trivial MR, trivial TR.  Patient was last seen February 2023 by Dr. Eden Colon.  Calcium score was ordered.  This showed coronary calcium score of 0.  Today, the patient reports she has chest pain in the left side of the chest and down the left arm. It started a couple weeks ago, she was very stressed about something (a friend passed away). Its a dull pain/ache and is constant. Stress makes it worse. Chest pain is not worse on exertion. It lasts for about 30 minutes. She does not feel short of breath with the pain. She also has acid-reflux. No lightheadedness. Some minor dizziness. LLE at the end of the day. No trouble breathing at night or laying flat. Has chest soreness as well. She just had blood work with PCP.   Past Medical History:  Diagnosis Date   Hypertension    Kidney stone    PONV (postoperative nausea and vomiting)     Past Surgical History:  Procedure Laterality Date   CARPAL TUNNEL RELEASE  2005 and 2006   bilateral    also cubital tunnel   CHOLECYSTECTOMY  11/07/2010   Procedure: LAPAROSCOPIC CHOLECYSTECTOMY;  Surgeon: Emily Colon;  Location: [facility];  Service: General;  Laterality: N/A;   COLONOSCOPY N/A 04/30/2016   Procedure: COLONOSCOPY;  Surgeon: Emily Bali, MD;  Location: [facility];  Service: Endoscopy;  Laterality: N/A;  9:45am   URETHRA SURGERY  age 62   WISDOM TOOTH EXTRACTION   2001    Current Medications: Current Meds  Medication Sig   amLODipine (NORVASC) 10 MG tablet Take 1 tablet (10 mg total) by mouth daily.   cyclobenzaprine (FLEXERIL) 10 MG tablet Take 10 mg by mouth as needed for muscle spasms.   ezetimibe (ZETIA) 10 MG tablet Take 10 mg by mouth daily.   levonorgestrel (MIRENA, 52 MG,) 20 MCG/DAY IUD Mirena 21 mcg/24 hours (8 yrs) 52 mg intrauterine device  Take 1 device by intrauterine route.   metFORMIN (GLUCOPHAGE-XR) 500 MG 24 hr tablet Take 1,000 mg by mouth daily.   Multiple Vitamins-Calcium (ONE-A-DAY WOMENS PO) Take 1 tablet by mouth daily.   olmesartan-hydrochlorothiazide (BENICAR HCT) 40-12.5 MG tablet Take 1 tablet by mouth once daily   Semaglutide (RYBELSUS) 7 MG TABS TAKE 1 TABLET BY MOUTH ONCE DAILY FOR 90 DAYS     Allergies:   Atorvastatin, Gluten meal, and Other   Social History   Socioeconomic History   Marital status: Married    Spouse name: Not on file   Number of children: Not on file   Years of education: Not on file   Highest education level: Not on file  Occupational History   Not on file  Tobacco Use   Smoking status: Never   Smokeless tobacco: Never  Vaping Use   Vaping Use: Never used  Substance and Sexual Activity   Alcohol use: No  Drug use: No   Sexual activity: Yes    Birth control/protection: Pill  Other Topics Concern   Not on file  Social History Narrative   Not on file   Social Determinants of Health   Financial Resource Strain: Not on file  Food Insecurity: Not on file  Transportation Needs: Not on file  Physical Activity: Not on file  Stress: Not on file  Social Connections: Not on file     Family History: The patient's family history includes Breast cancer in her maternal grandfather; Prostate cancer in her father. There is no history of Anesthesia problems, Hypotension, Malignant hyperthermia, Pseudochol deficiency, or Colon cancer.  ROS:   Please see the history of present illness.      All other systems reviewed and are negative.  EKGs/Labs/Other Studies Reviewed:    The following studies were reviewed today:  Echocardiogram 05/2017 Study Conclusions   - Left ventricle: The cavity size was normal. Wall thickness was    increased in a pattern of mild LVH. Systolic function was normal.    The estimated ejection fraction was in the range of 55% to 60%.    Wall motion was normal; there were no regional wall motion    abnormalities. Left ventricular diastolic function parameters    were normal for the patient&'s age.  - Mitral valve: There was trivial regurgitation.  - Right atrium: Central venous pressure (est): 3 mm Hg.  - Tricuspid valve: There was trivial regurgitation.  - Pulmonary arteries: Systolic pressure could not be accurately    estimated.  - Pericardium, extracardiac: A prominent pericardial fat pad was    present.   EKG:  EKG is ordered today.  The ekg ordered today demonstrates NSR 77bpm, nonspecific ST changes  Recent Labs: No results found for requested labs within last 365 days.  Recent Lipid Panel No results found for: "CHOL", "TRIG", "HDL", "CHOLHDL", "VLDL", "LDLCALC", "LDLDIRECT"   Physical Exam:    VS:  BP (!) 138/94 (BP Location: Left Arm, Patient Position: Sitting, Cuff Size: Large)   Pulse 77   Ht 5\' 7"  (1.702 m)   Wt 250 lb (113.4 kg)   SpO2 98%   BMI 39.16 kg/m     Wt Readings from Last 3 Encounters:  01/09/22 250 lb (113.4 kg)  04/02/21 251 lb (113.9 kg)  01/08/20 256 lb (116.1 kg)     GEN: Well nourished, well developed in no acute distress HEENT: Normal NECK: No JVD; No carotid bruits LYMPHATICS: No lymphadenopathy CARDIAC: RRR, no murmurs, rubs, gallops RESPIRATORY:  Clear to auscultation without rales, wheezing or rhonchi  ABDOMEN: Soft, non-tender, non-distended MUSCULOSKELETAL:  No edema; No deformity  SKIN: Warm and dry NEUROLOGIC:  Alert and oriented x 3 PSYCHIATRIC:  Normal affect   ASSESSMENT:    1.  Chest pain, unspecified type   2. Essential hypertension   3. Hyperlipidemia, mixed    PLAN:    In order of problems listed above:  Chest pain Patient reports left-sided chest pressure down the arm, worse with stress. Pain is not worse with exertion. Seems to be more atypical. Patient reports she has been under a lot of stress at home.  Patient had coronary calcium score of 0 in March 2023.  EKG today with no ischemic changes.  Very low suspicion this is cardiac pain.  Patient also has acid reflux which may be contributing. We discussed treadmill stress test for assurance, however we will defer at this time. She will call if symptoms change  or get worse.  Hypertension Blood pressure is mildly elevated today, however patient reports this is much better than prior.  Patient is taking zotepine 10 mg daily and Benicar 40/12.5 mg daily.  This is monitored by her PCP.  Hyperlipidemia LDL was 165 in 2022.  Patient is on Crestor 10 mg daily and Zetia.  This is followed by her PCP.  Disposition: Follow up in 1 year(s) with MD/APP    Signed, Kavi Almquist David Stall, PA-C  01/09/2022 1:57 PM    [facility]

## 2022-01-09 NOTE — Patient Instructions (Signed)
Medication Instructions:  Your physician recommends that you continue on your current medications as directed. Please refer to the Current Medication list given to you today.  *If you need a refill on your cardiac medications before your next appointment, please call your pharmacy*  Follow-Up: At [facility], you and your health needs are our priority.  As part of our continuing mission to provide you with exceptional heart care, we have created designated Provider Care Teams.  These Care Teams include your primary Cardiologist (physician) and Advanced Practice Providers (APPs -  Physician Assistants and Nurse Practitioners) who all work together to provide you with the care you need, when you need it.  We recommend signing up for the patient portal called "MyChart".  Sign up information is provided on this After Visit Summary.  MyChart is used to connect with patients for Virtual Visits (Telemedicine).  Patients are able to view lab/test results, encounter notes, upcoming appointments, etc.  Non-urgent messages can be sent to your provider as well.   To learn more about what you can do with MyChart, go to [email or web address].    Your next appointment:   1 year(s)  The format for your next appointment:   In Person  Provider:   Dr. Purvis Sheffield  Other Instructions   Important Information About Sugar

## 2022-01-12 NOTE — Addendum Note (Signed)
Addended by: Auburn Bilberry D on: 01/12/2022 12:20 PM   Modules accepted: Orders

## 2022-03-28 ENCOUNTER — Ambulatory Visit
Admission: RE | Admit: 2022-03-28 | Discharge: 2022-03-28 | Disposition: A | Discharge status: Home / Self Care | Payer: BC Managed Care – PPO | Source: Ambulatory Visit | Attending: Nurse Practitioner | Admitting: Nurse Practitioner

## 2022-03-28 VITALS — BP 151/91 | HR 90 | Temp 97.9°F | Resp 20

## 2022-03-28 DIAGNOSIS — J029 Acute pharyngitis, unspecified: Secondary | ICD-10-CM | POA: Diagnosis present

## 2022-03-28 DIAGNOSIS — R399 Unspecified symptoms and signs involving the genitourinary system: Secondary | ICD-10-CM | POA: Insufficient documentation

## 2022-03-28 DIAGNOSIS — Z1152 Encounter for screening for COVID-19: Secondary | ICD-10-CM | POA: Diagnosis present

## 2022-03-28 LAB — POCT URINALYSIS DIP (MANUAL ENTRY)
Bilirubin, UA: NEGATIVE
Glucose, UA: >=1000 mg/dL — AB
Ketones, POC UA: NEGATIVE mg/dL
Leukocytes, UA: NEGATIVE
Nitrite, UA: NEGATIVE
Protein Ur, POC: >=300 mg/dL — AB
Spec Grav, UA: >=1.03 — AB (ref 1.010–1.025)
Urobilinogen, UA: 0.2 E.U./dL
pH, UA: 6.5 (ref 5.0–8.0)

## 2022-03-28 LAB — POCT RAPID STREP A (OFFICE): Rapid Strep A Screen: NEGATIVE

## 2022-03-28 MED ORDER — LIDOCAINE VISCOUS HCL 2 % MT SOLN: 5.0000 mL | OROMUCOSAL | 0 refills | Status: DC | PRN

## 2022-03-28 NOTE — Discharge Instructions (Addendum)
The rapid strep test is negative.  A throat culture and COVID test are pending at this time. Take medication as prescribed. Increase fluids and allow for plenty of rest. Take over-the-counter Tylenol as needed for pain, fever, or general discomfort. Warm salt water gargles 3-4 times daily while symptoms persist. Recommend a soft diet to include soup, broth, yogurt, pudding, Jell-O, or applesauce while throat symptoms persist. May also use over-the-counter Chloraseptic throat spray to help with throat pain as needed. Please be advised that if your pending test results are negative, this most likely is a viral infection.  A viral infection can last up to 10 days.  If symptoms suddenly worsen before that time, or extend beyond that time, please follow-up in this clinic or with your PCP for further evaluation. Follow-up as needed.

## 2022-03-28 NOTE — ED Triage Notes (Signed)
Pt reports with throat pain x 2 days ago Reports some congestion a week ago as well    Pt has frequent urination, itching, urine had a strong odor, "seemed more acidic" and low abdominal pain  x 1 week.

## 2022-03-28 NOTE — ED Provider Notes (Signed)
[facility]    CSN: [id number] Arrival date & time: 03/28/22  9562      History   Chief Complaint Chief Complaint  Patient presents with   Sore Throat    Very sore throat. Drainage. - Entered by patient    HPI Emily Colon is a 42 y.o. female.   The history is provided by the patient.   The patient presents for complaints of sore throat that has been present for the past 2 days.  Patient states that pain was worse yesterday, but has improved today.  She states that prior to her symptoms starting she has noticed that she had a mild cough which is since improved, and some nasal drainage.  She denies fever, chills, ear pain, wheezing, shortness of breath, difficulty breathing, or GI symptoms.  Reports that she took some over-the-counter cough and cold medication for her symptoms which provided minimal relief at that time.  Patient reports that her throat pain has improved since yesterday.  Patient also complains of urinary frequency, and "an acidic smell" to her urine.  She states symptoms have been present over the last week.  She denies fever, chills, abdominal pain, dysuria, hematuria, hesitancy, low back pain, or flank pain.  Patient states that she has not been drinking water as she should, but has tried to increase her water intake over the last several days.  She states that she currently does have an IUD.  Denies history of recurrent urinary tract infections.  Past Medical History:  Diagnosis Date   Hypertension    Kidney stone    PONV (postoperative nausea and vomiting)     Patient Active Problem List   Diagnosis Date Noted   Hypertension 05/08/2021   Hyperlipidemia 05/08/2021   Elevated blood pressure reading 07/02/2016   Elevated LFTs 04/15/2016   Family history of colonic polyps 04/15/2016   Rectal bleeding 04/15/2016   POLYCYSTIC OVARIAN DISEASE 12/20/2006   CARPAL TUNNEL SYNDROME 12/20/2006    Past Surgical History:  Procedure Laterality Date    CARPAL TUNNEL RELEASE  2005 and 2006   bilateral    also cubital tunnel   CHOLECYSTECTOMY  11/07/2010   Procedure: LAPAROSCOPIC CHOLECYSTECTOMY;  Surgeon: Donato Heinz;  Location: [facility];  Service: General;  Laterality: N/A;   COLONOSCOPY N/A 04/30/2016   Procedure: COLONOSCOPY;  Surgeon: Danie Binder, MD;  Location: [facility];  Service: Endoscopy;  Laterality: N/A;  9:45am   URETHRA SURGERY  age 79   WISDOM TOOTH EXTRACTION  2001    OB History   No obstetric history on file.      Home Medications    Prior to Admission medications   Medication Sig Start Date End Date Taking? Authorizing Provider  lidocaine (XYLOCAINE) 2 % solution Use as directed 5 mLs in the mouth or throat as needed for mouth pain. Gargle and spit 5 mL every 6 hours as needed for severe throat pain. 03/28/22  Yes Monnie Gudgel-Warren, Alda Lea, NP  acetaminophen (TYLENOL) 500 MG tablet Take 1,000 mg by mouth 2 (two) times daily as needed for moderate pain or headache.    [provider]  amLODipine (NORVASC) 10 MG tablet Take 1 tablet (10 mg total) by mouth daily. 05/08/21   Josue Hector, MD  Cholecalciferol 25 MCG (1000 UT) CHEW Chew by mouth.    [provider]  cyclobenzaprine (FLEXERIL) 10 MG tablet Take 10 mg by mouth as needed for muscle spasms.    [provider]  ezetimibe (ZETIA) 10 MG tablet Take 10 mg by mouth daily. 10/06/21   [provider]  fluticasone (FLONASE) 50 MCG/ACT nasal spray Place 2 sprays into both nostrils daily. 07/15/20   Faustino Congress, NP  ibuprofen (ADVIL) 800 MG tablet Take 1 tablet (800 mg total) by mouth 3 (three) times daily. Take with food 02/24/19   Avegno, Darrelyn Hillock, FNP  levonorgestrel (MIRENA, 52 MG,) 20 MCG/DAY IUD Mirena 21 mcg/24 hours (8 yrs) 52 mg intrauterine device  Take 1 device by intrauterine route.    [provider]  metFORMIN (GLUCOPHAGE) 500 MG tablet Take 500 mg by mouth 2 (two) times daily. 12/06/19   [provider]  metFORMIN (GLUCOPHAGE-XR) 500 MG 24 hr tablet Take 1,000 mg by mouth daily. 10/06/21   [provider]  Multiple Vitamins-Calcium (ONE-A-DAY WOMENS PO) Take 1 tablet by mouth daily.    [provider]  olmesartan-hydrochlorothiazide (BENICAR HCT) 40-12.5 MG tablet Take 1 tablet by mouth once daily 11/06/21   Josue Hector, MD  OZEMPIC, 0.25 OR 0.5 MG/DOSE, 2 MG/1.5ML SOPN Inject 1 mg into the skin once a week. 12/01/19   [provider]  rosuvastatin (CRESTOR) 10 MG tablet Take 5 mg by mouth daily. 07/10/21   Supple, Harlon Flor, RPH-CPP  Semaglutide (RYBELSUS) 7 MG TABS TAKE 1 TABLET BY MOUTH ONCE DAILY FOR 90 DAYS 10/06/21   [provider]    Family History Family History  Problem Relation Age of Onset   Prostate cancer Father    Breast cancer Maternal Grandfather    Anesthesia problems Neg Hx    Hypotension Neg Hx    Malignant hyperthermia Neg Hx    Pseudochol deficiency Neg Hx    Colon cancer Neg Hx     Social History Social History   Tobacco Use   Smoking status: Never   Smokeless tobacco: Never  Vaping Use   Vaping Use: Never used  Substance Use Topics   Alcohol use: No   Drug use: No     Allergies   Atorvastatin, Gluten meal, and Other   Review of Systems Review of Systems Per HPI  Physical Exam Triage Vital Signs ED Triage Vitals  Enc Vitals Group     BP 03/28/22 0840 (!) 151/91     Pulse Rate 03/28/22 0840 90     Resp 03/28/22 0840 20     Temp 03/28/22 0840 97.9 F (36.6 C)     Temp Source 03/28/22 0840 Oral     SpO2 03/28/22 0840 97 %     Weight --      Height --      Head Circumference --      Peak Flow --      Pain Score 03/28/22 0845 5     Pain Loc --      Pain Edu? --      Excl. in [place]? --    No data found.  Updated Vital Signs BP (!) 151/91 (BP Location: Right Arm)   Pulse 90   Temp 97.9 F (36.6 C) (Oral)   Resp 20   SpO2 97%   Visual Acuity Right Eye Distance:   Left Eye Distance:    Bilateral Distance:    Right Eye Near:   Left Eye Near:    Bilateral Near:     Physical Exam Vitals and nursing note reviewed.  Constitutional:      General: She is not in acute distress.    Appearance:  She is well-developed.  HENT:     Head: Normocephalic.     Right Ear: Tympanic membrane and ear canal normal.     Left Ear: Tympanic membrane and ear canal normal.     Nose: Congestion present. No rhinorrhea.     Right Turbinates: Enlarged and swollen.     Left Turbinates: Enlarged and swollen.     Right Sinus: No maxillary sinus tenderness or frontal sinus tenderness.     Left Sinus: No maxillary sinus tenderness or frontal sinus tenderness.     Mouth/Throat:     Lips: Pink.     Mouth: Mucous membranes are moist.     Pharynx: Uvula midline. Pharyngeal swelling and posterior oropharyngeal erythema present. No oropharyngeal exudate or uvula swelling.     Tonsils: No tonsillar exudate. 1+ on the right. 1+ on the left.  Eyes:     Conjunctiva/sclera: Conjunctivae normal.     Pupils: Pupils are equal, round, and reactive to light.  Cardiovascular:     Rate and Rhythm: Normal rate and regular rhythm.     Heart sounds: Normal heart sounds.  Pulmonary:     Effort: Pulmonary effort is normal. No respiratory distress.     Breath sounds: Normal breath sounds. No stridor. No wheezing, rhonchi or rales.  Abdominal:     General: Bowel sounds are normal.     Palpations: Abdomen is soft.     Tenderness: There is no abdominal tenderness. There is no right CVA tenderness or left CVA tenderness.  Musculoskeletal:     Cervical back: Normal range of motion.  Lymphadenopathy:     Cervical: No cervical adenopathy.  Skin:    General: Skin is warm and dry.  Neurological:     General: No focal deficit present.     Mental Status: She is alert and oriented to person, place, and time.  Psychiatric:        Mood and Affect: Mood normal.        Behavior: Behavior normal.      UC Treatments /  Results  Labs (all labs ordered are listed, but only abnormal results are displayed) Labs Reviewed  POCT URINALYSIS DIP (MANUAL ENTRY) - Abnormal; Notable for the following components:      Result Value   Clarity, UA hazy (*)    Glucose, UA >=1,000 (*)    Spec Grav, UA >=1.030 (*)    Blood, UA trace-intact (*)    Protein Ur, POC >=300 (*)    All other components within normal limits  URINE CULTURE  CULTURE, GROUP A STREP ([facility])  SARS CORONAVIRUS 2 (TAT 6-24 HRS)  POCT RAPID STREP A (OFFICE)    EKG   Radiology No results found.  Procedures Procedures (including critical care time)  Medications Ordered in UC Medications - No data to display  Initial Impression / Assessment and Plan / UC Course  I have reviewed the triage vital signs and the nursing notes.  Pertinent labs & imaging results that were available during my care of the patient were reviewed by me and considered in my medical decision making (see chart for details).  The patient is well-appearing, she is in no acute distress, she is hypertensive, but vitals are otherwise stable.  Patient reports that she just took her BP medication before she left home this morning.  Rapid strep test is negative, urinalysis is positive for RBCs, protein, and glucose.  Throat culture and urine culture are pending at this time.  With regard to  her throat pain, suspect a viral pharyngitis at this time.  Will provide treatment with viscous lidocaine 2% for patient to gargle and spit as needed for severe throat pain with regard to her urinary symptoms, urine culture is pending.  Patient advised that she will be contacted if the culture results are positive.  COVID test is also pending.  Patient is a candidate to receive molnupiravir as an antiviral therapy if her COVID test is positive.  Supportive care recommendations were provided to the patient.  Discussed follow-up precautions with the patient along with viral etiology.  Patient is in  agreement with this plan of care.  Patient verbalizes understanding.  All questions were answered.  Patient is stable for discharge.   Final Clinical Impressions(s) / UC Diagnoses   Final diagnoses:  Acute pharyngitis, unspecified etiology  Urinary tract infection symptoms  Encounter for screening for COVID-19     Discharge Instructions      The rapid strep test is negative.  A throat culture and COVID test are pending at this time. Take medication as prescribed. Increase fluids and allow for plenty of rest. Take over-the-counter Tylenol as needed for pain, fever, or general discomfort. Warm salt water gargles 3-4 times daily while symptoms persist. Recommend a soft diet to include soup, broth, yogurt, pudding, Jell-O, or applesauce while throat symptoms persist. May also use over-the-counter Chloraseptic throat spray to help with throat pain as needed. Please be advised that if your pending test results are negative, this most likely is a viral infection.  A viral infection can last up to 10 days.  If symptoms suddenly worsen before that time, or extend beyond that time, please follow-up in this clinic or with your PCP for further evaluation. Follow-up as needed.     ED Prescriptions     Medication Sig Dispense Auth. Provider   lidocaine (XYLOCAINE) 2 % solution Use as directed 5 mLs in the mouth or throat as needed for mouth pain. Gargle and spit 5 mL every 6 hours as needed for severe throat pain. 100 mL Khila Papp-Warren, Alda Lea, NP      PDMP not reviewed this encounter.   Tish Men, NP 03/28/22 [id number]

## 2022-03-29 LAB — URINE CULTURE

## 2022-03-29 LAB — SARS CORONAVIRUS 2 (TAT 6-24 HRS): SARS Coronavirus 2: NEGATIVE

## 2022-03-31 LAB — CULTURE, GROUP A STREP (THRC)

## 2022-04-29 ENCOUNTER — Other Ambulatory Visit: Payer: Self-pay | Admitting: Obstetrics and Gynecology

## 2022-04-29 DIAGNOSIS — Z9189 Other specified personal risk factors, not elsewhere classified: Secondary | ICD-10-CM

## 2022-06-12 ENCOUNTER — Encounter: Payer: Self-pay | Admitting: Nurse Practitioner

## 2022-06-12 ENCOUNTER — Ambulatory Visit: Payer: BC Managed Care – PPO | Attending: Nurse Practitioner | Admitting: Nurse Practitioner

## 2022-06-12 VITALS — BP 122/88 | HR 86 | Ht 67.0 in | Wt 247.0 lb

## 2022-06-12 DIAGNOSIS — R252 Cramp and spasm: Secondary | ICD-10-CM

## 2022-06-12 DIAGNOSIS — R42 Dizziness and giddiness: Secondary | ICD-10-CM | POA: Diagnosis not present

## 2022-06-12 DIAGNOSIS — I10 Essential (primary) hypertension: Secondary | ICD-10-CM

## 2022-06-12 NOTE — Progress Notes (Addendum)
Office Visit    Patient Name: Emily Colon Date of Encounter: 06/12/2022  PCP:  Benita Stabile, MD   [facility]  Cardiologist:  Charlton Haws, MD  Advanced Practice Provider:  No care team member to display Electrophysiologist:  None 46}  Chief Complaint    Emily Colon is a very pleasant 42 y.o. female with a hx of hypertension, history of rectal bleeding, PCOS, carpal tunnel syndrome, and elevated LFTs, who presents presents today for scheduled follow-up.  Past Medical History    Past Medical History:  Diagnosis Date   Hypertension    Kidney stone    PONV (postoperative nausea and vomiting)    Past Surgical History:  Procedure Laterality Date   CARPAL TUNNEL RELEASE  2005 and 2006   bilateral    also cubital tunnel   CHOLECYSTECTOMY  11/07/2010   Procedure: LAPAROSCOPIC CHOLECYSTECTOMY;  Surgeon: Fabio Bering;  Location: [facility];  Service: General;  Laterality: N/A;   COLONOSCOPY N/A 04/30/2016   Procedure: COLONOSCOPY;  Surgeon: West Bali, MD;  Location: [facility];  Service: Endoscopy;  Laterality: N/A;  9:45am   URETHRA SURGERY  age 54   WISDOM TOOTH EXTRACTION  2001    Allergies  Allergies  Allergen Reactions   Atorvastatin     Muscle cramping on 10mg  dosing   Gluten Meal Swelling    Joint pain, lethargy    Other Nausea And Vomiting    Quinoa    History of Present Illness    Emily Colon is a very pleasant 42 y.o. female with a PMH as mentioned above.  Previous patient of Dr. Purvis Sheffield.  Seen by Dr. Eden Emms in February 2023.  Calcium score was ordered that showed coronary calcium score 0.  Last seen by Cadence Lorna Few on January 09, 2022.  She noted chest pain on the left side, radiating down left arm.  Associated with stress at the time.  Stress made it worse.  Denied any exertional symptoms, lasted for about 30 minutes, denied any shortness of breath.  Noted leg edema at the end of the day and minor  dizziness.  Low suspicion for cardiac chest pain.  Treadmill stress test was discussed, patient deferred at the time.  Today she presents for scheduled follow-up.  She states she had a dizzy spell a couple weeks ago while at work, while attending a meeting.  Stated it was a hot day, she had walked outside prior to the meeting, stood up, and stomach began to cramp, had to sit down and husband had to take her home.  Episode lasted around 1 hour, denies any stress surrounding event.  Was not eating much prior to meeting, says she has been focusing on losing weight and changing her diet.  Has had a few dizzy spells since this, not as bad or severe, brief in duration.  Wondering if vertigo is contributing to this.  Had labs drawn with PCP - all overall unremarkable.  Also notes occasional cramping in feet/arms, not bothersome per her report.  Does have noted gluten allergy, states she had some minimal gluten prior to dizzy spell at work. Denies any chest pain, shortness of breath, palpitations, dizziness, orthopnea, PND, swelling or significant weight changes, acute bleeding, or claudication.    SH: Works at [employer] FH: Her father has history of prior stroke, history of high blood pressure.  Family history significant for high blood pressure  EKGs/Labs/Other Studies Reviewed:  The following studies were reviewed today:   EKG:  EKG is not ordered today.   CT cardiac scoring 04/2021:  Coronary calcium score 0.  Echo 05/2017:  Study Conclusions   - Left ventricle: The cavity size was normal. Wall thickness was    increased in a pattern of mild LVH. Systolic function was normal.    The estimated ejection fraction was in the range of 55% to 60%.    Wall motion was normal; there were no regional wall motion    abnormalities. Left ventricular diastolic function parameters    were normal for the patient&'s age.  - Mitral valve: There was trivial regurgitation.  - Right atrium:  Central venous pressure (est): 3 mm Hg.  - Tricuspid valve: There was trivial regurgitation.  - Pulmonary arteries: Systolic pressure could not be accurately    estimated.  - Pericardium, extracardiac: A prominent pericardial fat pad was    present.   Recent Labs: No results found for requested labs within last 365 days.  Recent Lipid Panel No results found for: "CHOL", "TRIG", "HDL", "CHOLHDL", "VLDL", "LDLCALC", "LDLDIRECT"  Risk Assessment/Calculations:   The 10-year ASCVD risk score (Arnett DK, et al., 2019) is: 1.7%   Values used to calculate the score:     Age: 30 years     Sex: Female     Is Non-Hispanic African American: No     Diabetic: Yes     Tobacco smoker: No     Systolic Blood Pressure: 122 mmHg     Is BP treated: Yes     HDL Cholesterol: 47 mg/dL     Total Cholesterol: 194 mg/dL   Home Medications   Current Meds  Medication Sig   amLODipine (NORVASC) 10 MG tablet Take 1 tablet (10 mg total) by mouth daily.   levonorgestrel (MIRENA, 52 MG,) 20 MCG/DAY IUD Mirena 21 mcg/24 hours (8 yrs) 52 mg intrauterine device  Take 1 device by intrauterine route.   MAGNESIUM OXIDE PO Take 165 mg by mouth daily.   metFORMIN (GLUCOPHAGE-XR) 500 MG 24 hr tablet Take 1,000 mg by mouth daily.   Multiple Vitamins-Calcium (ONE-A-DAY WOMENS PO) Take 1 tablet by mouth daily.   olmesartan-hydrochlorothiazide (BENICAR HCT) 40-12.5 MG tablet Take 1 tablet by mouth once daily   Red Yeast Rice Extract (RED YEAST RICE PO) Take 1 tablet by mouth daily.   Semaglutide (RYBELSUS) 7 MG TABS TAKE 1 TABLET BY MOUTH ONCE DAILY FOR 90 DAYS   Turmeric (QC TUMERIC COMPLEX PO) Take 1 tablet by mouth daily.     Review of Systems    All other systems reviewed and are otherwise negative except as noted above.  Physical Exam    VS:  BP 122/88 (BP Location: Left Arm, Patient Position: Sitting, Cuff Size: Large)   Pulse 86   Ht  (1.702 m)   Wt 247 lb (112 kg)   SpO2 99%   BMI 38.69 kg/m  ,  BMI Body mass index is 38.69 kg/m.  Wt Readings from Last 3 Encounters:  06/12/22 247 lb (112 kg)  01/09/22 250 lb (113.4 kg)  04/02/21 251 lb (113.9 kg)     GEN: Obese, 42 year old female in no acute distress. HEENT: normal. Neck: Supple, no JVD, carotid bruits, or masses. Cardiac: S1/S2, RRR, no murmurs, rubs, or gallops. No clubbing, cyanosis.  Nonpitting edema, trace along BLE.  Radials/PT 2+ and equal bilaterally.  Respiratory:  Respirations regular and unlabored, clear to auscultation bilaterally. GI: Soft, nontender, nondistended.  MS: No deformity or atrophy. Skin: Warm and dry, no rash. Neuro:  Strength and sensation are intact. Psych: Normal affect.  Assessment & Plan    Dizziness Recent episode at work, lasted 1 hour.  Denies any syncope/presyncope with this.  Orthostatics obtained, negative for orthostatic hypotension.  Did have minimal dizziness when going from supine to sitting position.  Etiology multifactorial, sounds related to meals/possible vertigo.  Sounds less likely cardiac in etiology.  Discussed conservative measures, including Epley maneuvers.  If no improvement by next follow-up visit, consider monitor for evaluation for any arrhythmias, politely defers at this time as she has not had any palpitations/tachycardia.  Heart healthy diet encouraged.  Hypertension Blood pressure stable. Discussed to monitor BP at home at least 2 hours after medications and sitting for 5-10 minutes.  No medication changes at this time.  Recent lab work stable. Heart healthy diet and regular cardiovascular exercise encouraged.   Cramping of feet/arm Sounds musculoskeletal related.  Negative Homans' sign on exam.  Recent labs stable.  Recommended to take Tylenol.  Heart healthy diet and regular cardiovascular exercise encouraged.  Disposition: Follow up in 3 month(s) with Charlton Haws, MD or APP.  Signed, Sharlene Dory, NP 06/12/2022, 9:35 AM [facility]

## 2022-06-12 NOTE — Patient Instructions (Signed)
Medication Instructions:   Your physician recommends that you continue on your current medications as directed. Please refer to the Current Medication list given to you today.  Labwork:  none  Testing/Procedures:  none  Follow-Up:  Your physician recommends that you schedule a follow-up appointment in: 3 months.  Any Other Special Instructions Will Be Listed Below (If Applicable).  If you need a refill on your cardiac medications before your next appointment, please call your pharmacy. 

## 2022-09-10 ENCOUNTER — Encounter: Payer: Self-pay | Admitting: Nurse Practitioner

## 2022-09-10 ENCOUNTER — Ambulatory Visit: Payer: BC Managed Care – PPO | Attending: Nurse Practitioner | Admitting: Nurse Practitioner

## 2022-09-10 VITALS — BP 140/90 | HR 66 | Ht 67.0 in | Wt 250.0 lb

## 2022-09-10 DIAGNOSIS — R42 Dizziness and giddiness: Secondary | ICD-10-CM

## 2022-09-10 DIAGNOSIS — E781 Pure hyperglyceridemia: Secondary | ICD-10-CM | POA: Diagnosis not present

## 2022-09-10 DIAGNOSIS — E785 Hyperlipidemia, unspecified: Secondary | ICD-10-CM | POA: Diagnosis not present

## 2022-09-10 DIAGNOSIS — R7989 Other specified abnormal findings of blood chemistry: Secondary | ICD-10-CM

## 2022-09-10 DIAGNOSIS — I10 Essential (primary) hypertension: Secondary | ICD-10-CM

## 2022-09-10 DIAGNOSIS — E669 Obesity, unspecified: Secondary | ICD-10-CM

## 2022-09-10 MED ORDER — AMLODIPINE BESYLATE 10 MG PO TABS: 10.0000 mg | ORAL_TABLET | Freq: Every day | ORAL | 3 refills | Status: AC

## 2022-09-10 NOTE — Patient Instructions (Addendum)
Medication Instructions:  Your physician recommends that you continue on your current medications as directed. Please refer to the Current Medication list given to you today.  Labwork: None  Testing/Procedures: none  Follow-Up: Your physician recommends that you schedule a follow-up appointment in: 4-5 Months with Philis Nettle or APP  Any Other Special Instructions Will Be Listed Below (If Applicable).  IF YOUR TOP NUMBER OF YOUR BLOOD PRESSURE REMAINS HIGHER THAN 130 PLEASE CONTACT OUR OFFICE.           Mediterranean Diet  Why follow it? Research shows. Those who follow the Mediterranean diet have a reduced risk of heart disease  The diet is associated with a reduced incidence of Parkinson's and Alzheimer's diseases People following the diet may have longer life expectancies and lower rates of chronic diseases  The Dietary Guidelines for Americans recommends the Mediterranean diet as an eating plan to promote health and prevent disease  What Is the Mediterranean Diet?  Healthy eating plan based on typical foods and recipes of Mediterranean-style cooking The diet is primarily a plant based diet; these foods should make up a majority of meals   Starches - Plant based foods should make up a majority of meals - They are an important sources of vitamins, minerals, energy, antioxidants, and fiber - Choose whole grains, foods high in fiber and minimally processed items  - Typical grain sources include wheat, oats, barley, corn, brown rice, bulgar, farro, millet, polenta, couscous  - Various types of beans include chickpeas, lentils, fava beans, black beans, white beans   Fruits  Veggies - Large quantities of antioxidant rich fruits & veggies; 6 or more servings  - Vegetables can be eaten raw or lightly drizzled with oil and cooked  - Vegetables common to the traditional Mediterranean Diet include: artichokes, arugula, beets, broccoli, brussel sprouts, cabbage, carrots, celery, collard  greens, cucumbers, eggplant, kale, leeks, lemons, lettuce, mushrooms, okra, onions, peas, peppers, potatoes, pumpkin, radishes, rutabaga, shallots, spinach, sweet potatoes, turnips, zucchini - Fruits common to the Mediterranean Diet include: apples, apricots, avocados, cherries, clementines, dates, figs, grapefruits, grapes, melons, nectarines, oranges, peaches, pears, pomegranates, strawberries, tangerines  Fats - Replace butter and margarine with healthy oils, such as olive oil, canola oil, and tahini  - Limit nuts to no more than a handful a day  - Nuts include walnuts, almonds, pecans, pistachios, pine nuts  - Limit or avoid candied, honey roasted or heavily salted nuts - Olives are central to the [employer] - can be eaten whole or used in a variety of dishes   Meats Protein - Limiting red meat: no more than a few times a month - When eating red meat: choose lean cuts and keep the portion to the size of deck of cards - Eggs: approx. 0 to 4 times a week  - Fish and lean poultry: at least 2 a week  - Healthy protein sources include, chicken, [place], lean beef, lamb - Increase intake of seafood such as tuna, salmon, trout, mackerel, shrimp, scallops - Avoid or limit high fat processed meats such as sausage and bacon  Dairy - Include moderate amounts of low fat dairy products  - Focus on healthy dairy such as fat free yogurt, skim milk, low or reduced fat cheese - Limit dairy products higher in fat such as whole or 2% milk, cheese, ice cream  Alcohol - Moderate amounts of red wine is ok  - No more than 5 oz daily for women (all ages) and men older than  age 77  - No more than 10 oz of wine daily for men younger than 18  Other - Limit sweets and other desserts  - Use herbs and spices instead of salt to flavor foods  - Herbs and spices common to the traditional Mediterranean Diet include: basil, bay leaves, chives, cloves, cumin, fennel, garlic, lavender, marjoram, mint, oregano, parsley,  pepper, rosemary, sage, savory, sumac, tarragon, thyme   It's not just a diet, it's a lifestyle:  The Mediterranean diet includes lifestyle factors typical of those in the region  Foods, drinks and meals are best eaten with others and savored Daily physical activity is important for overall good health This could be strenuous exercise like running and aerobics This could also be more leisurely activities such as walking, housework, yard-work, or taking the stairs Moderation is the key; a balanced and healthy diet accommodates most foods and drinks Consider portion sizes and frequency of consumption of certain foods   Meal Ideas & Options:  Breakfast:  Whole wheat toast or whole wheat English muffins with peanut butter & hard boiled egg Steel cut oats topped with apples & cinnamon and skim milk  Fresh fruit: banana, strawberries, melon, berries, peaches  Smoothies: strawberries, bananas, greek yogurt, peanut butter Low fat greek yogurt with blueberries and granola  Egg white omelet with spinach and mushrooms Breakfast couscous: whole wheat couscous, apricots, skim milk, cranberries  Sandwiches:  Hummus and grilled vegetables (peppers, zucchini, squash) on whole wheat bread   Grilled chicken on whole wheat pita with lettuce, tomatoes, cucumbers or tzatziki  [place] salad on whole wheat bread: tuna salad made with greek yogurt, olives, red peppers, capers, green onions Garlic rosemary lamb pita: lamb sauted with garlic, rosemary, salt & pepper; add lettuce, cucumber, greek yogurt to pita - flavor with lemon juice and black pepper  Seafood:  Mediterranean grilled salmon, seasoned with garlic, basil, parsley, lemon juice and black pepper Shrimp, lemon, and spinach whole-grain pasta salad made with low fat greek yogurt  Seared scallops with lemon orzo  Seared tuna steaks seasoned salt, pepper, coriander topped with tomato mixture of olives, tomatoes, olive oil, minced garlic, parsley, green  onions and cappers  Meats:  Herbed greek chicken salad with kalamata olives, cucumber, feta  Red bell peppers stuffed with spinach, bulgur, lean ground beef (or lentils) & topped with feta   Kebabs: skewers of chicken, tomatoes, onions, zucchini, squash  [place] burgers: made with red onions, mint, dill, lemon juice, feta cheese topped with roasted red peppers Vegetarian Cucumber salad: cucumbers, artichoke hearts, celery, red onion, feta cheese, tossed in olive oil & lemon juice  Hummus and whole grain pita points with a greek salad (lettuce, tomato, feta, olives, cucumbers, red onion) Lentil soup with celery, carrots made with vegetable broth, garlic, salt and pepper  Tabouli salad: parsley, bulgur, mint, scallions, cucumbers, tomato, radishes, lemon juice, olive oil, salt and pepper.   If you need a refill on your cardiac medications before your next appointment, please call your pharmacy.

## 2022-09-10 NOTE — Progress Notes (Signed)
Office Visit    Patient Name: Emily Colon Date of Encounter: 09/10/2022 PCP:  Benita Stabile, MD [facility]  Cardiologist:  Charlton Haws, MD  Advanced Practice Provider:  No care team member to display Electrophysiologist:  None 46}  Chief Complaint and HPI    Emily Colon is a very pleasant 42 y.o. female with a hx of hypertension, history of rectal bleeding, PCOS, carpal tunnel syndrome, dizziness, HLD, hypertriglyceridemia, obesity, and elevated LFTs, who presents today for scheduled follow-up.  Last saw patient on June 12, 2022. Noted a dizzy spell while at work, while attending a meeting.  Stated it was a hot day, she had walked outside prior to the meeting, stood up, and stomach began to cramp, had to sit down and husband had to take her home.  Episode lasted around 1 hour, denied any stress surrounding event.  Was not eating much prior to meeting, said she had been focusing on losing weight and changing her diet. Noted a few dizzy spells since this, not as bad or severe, brief in duration.  Was wondering if vertigo was contributing to this.  Had labs drawn with PCP - all overall unremarkable.  Also noted occasional cramping in feet/arms, not bothersome per her report.    Today she presents for follow-up. Says she is doing very well. Hasn't had any recent dizziness spells, but does note some remote dizzy spells, sounds similar to possible vertigo, says it is difficult for her to tell. Denies any chest pain, shortness of breath, palpitations, syncope, presyncope, orthopnea, PND, swelling or significant weight changes, acute bleeding, or claudication.   SH: Works at [employer], has recently traveled to [place], [place] for work.  FH: Her father has history of prior stroke, history of high blood pressure.  Family history significant for high blood pressure  EKGs/Labs/Other Studies Reviewed:   The following studies were reviewed  today:   EKG:  EKG is not ordered today.   CT cardiac scoring 04/2021:  Coronary calcium score 0.  Echo 05/2017:  Study Conclusions   - Left ventricle: The cavity size was normal. Wall thickness was    increased in a pattern of mild LVH. Systolic function was normal.    The estimated ejection fraction was in the range of 55% to 60%.    Wall motion was normal; there were no regional wall motion    abnormalities. Left ventricular diastolic function parameters    were normal for the patient&'s age.  - Mitral valve: There was trivial regurgitation.  - Right atrium: Central venous pressure (est): 3 mm Hg.  - Tricuspid valve: There was trivial regurgitation.  - Pulmonary arteries: Systolic pressure could not be accurately    estimated.  - Pericardium, extracardiac: A prominent pericardial fat pad was    present.   Risk Assessment/Calculations:   The 10-year ASCVD risk score (Arnett DK, et al., 2019) is: 2.4%   Values used to calculate the score:     Age: 63 years     Sex: Female     Is Non-Hispanic African American: No     Diabetic: Yes     Tobacco smoker: No     Systolic Blood Pressure: 140 mmHg     Is BP treated: Yes     HDL Cholesterol: 47 mg/dL     Total Cholesterol: 194 mg/dL  Review of Systems    All other systems reviewed and are otherwise negative except as noted above.  Physical Exam    VS:  BP (!) 140/90 (BP Location: Left Arm)   Pulse 66   Ht 5\' 7"  (1.702 m)   Wt 250 lb (113.4 kg)   SpO2 99%   BMI 39.16 kg/m  , BMI Body mass index is 39.16 kg/m.  Wt Readings from Last 3 Encounters:  09/10/22 250 lb (113.4 kg)  06/12/22 247 lb (112 kg)  01/09/22 250 lb (113.4 kg)    Repeat BP: 141/87  GEN: Obese, 42 year old female in no acute distress. HEENT: normal. Neck: Supple, no JVD, carotid bruits, or masses. Cardiac: S1/S2, RRR, no murmurs, rubs, or gallops. No clubbing, cyanosis.  Nonpitting edema, trace along BLE.  Radials/PT 2+ and equal bilaterally.   Respiratory:  Respirations regular and unlabored, clear to auscultation bilaterally. GI: Soft, nontender, nondistended. MS: No deformity or atrophy. Skin: Warm and dry, no rash. Neuro:  Strength and sensation are intact. Psych: Normal affect.  Assessment & Plan    Dizziness Denies any recent symptoms, etiology unclear, possible vertigo in etiology. Denies any syncope.  Past orthostatics negative. Discussed conservative measures. She verbalized understanding. Heart healthy diet encouraged.  Hypertension Blood pressure mildly elevated today, she is out of amlodipine, SBP averages 130's-140's, discussed SBP goal < 130. Will refill Amlodipine. Continue rest of medication regimen. Discussed to monitor BP at home at least 2 hours after medications and sitting for 5-10 minutes.  No medication changes at this time.  Heart healthy diet and regular cardiovascular exercise encouraged. She will call us if SBP remains > 130 in next few weeks.   HLD, hypertriglyceridemia, elevated liver function enzymes LDL panel 04/2022 revealed TC 283, TG 348, HDL 55, and LDL 162. Previous intolerance to statin. Currently being managed by PCP, only on red yeast rice. States she has upcoming labs and declines further medical management until labs are obtained by PCP. Discussed lifestyle modifications. Heart healthy diet and regular cardiovascular exercise encouraged. Discussed about possible referral to Lipid Clinic to discuss other options. Will revisit this at next visit.   4. Obesity Weight loss via diet and exercise encouraged. Discussed the impact being overweight would have on cardiovascular risk.  Disposition: Follow up in 4-5 months with Charlton Haws, MD or APP.  Signed, Sharlene Dory, NP 09/10/2022, 9:32 AM [facility]

## 2022-11-24 ENCOUNTER — Other Ambulatory Visit: Payer: Self-pay | Admitting: Cardiovascular Disease

## 2022-12-28 ENCOUNTER — Other Ambulatory Visit: Payer: BC Managed Care – PPO

## 2023-01-01 ENCOUNTER — Ambulatory Visit
Admission: RE | Admit: 2023-01-01 | Discharge: 2023-01-01 | Disposition: A | Discharge status: Home / Self Care | Payer: BC Managed Care – PPO | Source: Ambulatory Visit | Attending: Obstetrics and Gynecology | Admitting: Obstetrics and Gynecology

## 2023-01-01 DIAGNOSIS — Z9189 Other specified personal risk factors, not elsewhere classified: Secondary | ICD-10-CM

## 2023-01-01 MED ORDER — GADOPICLENOL 0.5 MMOL/ML IV SOLN
10.0000 mL | Freq: Once | INTRAVENOUS | Status: AC | PRN
  Administered 2023-01-01: 10 mL via INTRAVENOUS

## 2023-02-11 ENCOUNTER — Encounter: Payer: Self-pay | Admitting: Nurse Practitioner

## 2023-02-11 ENCOUNTER — Ambulatory Visit: Payer: BC Managed Care – PPO | Attending: Nurse Practitioner | Admitting: Nurse Practitioner

## 2023-02-11 VITALS — BP 128/72 | HR 72 | Ht 67.0 in | Wt 241.0 lb

## 2023-02-11 DIAGNOSIS — I10 Essential (primary) hypertension: Secondary | ICD-10-CM

## 2023-02-11 DIAGNOSIS — E785 Hyperlipidemia, unspecified: Secondary | ICD-10-CM | POA: Diagnosis not present

## 2023-02-11 DIAGNOSIS — E781 Pure hyperglyceridemia: Secondary | ICD-10-CM

## 2023-02-11 DIAGNOSIS — R7989 Other specified abnormal findings of blood chemistry: Secondary | ICD-10-CM

## 2023-02-11 DIAGNOSIS — R0789 Other chest pain: Secondary | ICD-10-CM

## 2023-02-11 DIAGNOSIS — E669 Obesity, unspecified: Secondary | ICD-10-CM

## 2023-02-11 MED ORDER — OLMESARTAN MEDOXOMIL-HCTZ 40-12.5 MG PO TABS: 1.0000 | ORAL_TABLET | Freq: Every day | ORAL | 2 refills | Status: DC

## 2023-02-11 NOTE — Patient Instructions (Addendum)

## 2023-02-11 NOTE — Progress Notes (Signed)
Office Visit    Patient Name: Emily Colon Date of Encounter: 02/11/2023 PCP:  Benita Stabile, MD  [facility]  Cardiologist:  Charlton Haws, MD  Advanced Practice Provider:  No care team member to display Electrophysiologist:  None 46}  Chief Complaint and HPI    Emily Colon is a very pleasant 42 y.o. female with a hx of hypertension, history of rectal bleeding, PCOS, carpal tunnel syndrome, dizziness, HLD, hypertriglyceridemia, obesity, and elevated LFTs, who presents today for scheduled follow-up.  Last saw her for follow-up on September 10, 2022. Was doing very well.   Today she presents for follow-up. Admits to recent atypical chest pain during time she had pneumonia, has just completed ABX therapy for this. Describes CP as sharp, tender to touch along central chest, did note episodes of coughing. She also wonders if CP is related to her neck pain, as occurred together. Describes CP episodes as very brief in duration. Has gotten better over time, currently has tongue pain, believes she has thrush. Denies any shortness of breath, syncope, presyncope, dizziness, orthopnea, PND, swelling or significant weight changes, acute bleeding, or claudication.  SH: Works at [employer], has recently traveled to [place], [place] for work.  FH: Her father has history of prior stroke, history of high blood pressure.  Family history significant for high blood pressure  EKGs/Labs/Other Studies Reviewed:   The following studies were reviewed today:   EKG:   EKG Interpretation Date/Time:  Thursday February 11 2023 10:34:23 EST Ventricular Rate:  84 PR Interval:  170 QRS Duration:  90 QT Interval:  390 QTC Calculation: 460 R Axis:   121  Text Interpretation: Normal sinus rhythm Left posterior fascicular block No previous ECGs available Confirmed by Sharlene Dory [id number]) on 02/11/2023 10:37:43 AM   CT cardiac scoring 04/2021:  Coronary calcium score  0.  Echo 05/2017:  Study Conclusions   - Left ventricle: The cavity size was normal. Wall thickness was    increased in a pattern of mild LVH. Systolic function was normal.    The estimated ejection fraction was in the range of 55% to 60%.    Wall motion was normal; there were no regional wall motion    abnormalities. Left ventricular diastolic function parameters    were normal for the patient&'s age.  - Mitral valve: There was trivial regurgitation.  - Right atrium: Central venous pressure (est): 3 mm Hg.  - Tricuspid valve: There was trivial regurgitation.  - Pulmonary arteries: Systolic pressure could not be accurately    estimated.  - Pericardium, extracardiac: A prominent pericardial fat pad was    present.   Risk Assessment/Calculations:   The 10-year ASCVD risk score (Arnett DK, et al., 2019) is: 2%   Values used to calculate the score:     Age: 67 years     Sex: Female     Is Non-Hispanic African American: No     Diabetic: Yes     Tobacco smoker: No     Systolic Blood Pressure: 128 mmHg     Is BP treated: Yes     HDL Cholesterol: 47 mg/dL     Total Cholesterol: 194 mg/dL  Review of Systems    All other systems reviewed and are otherwise negative except as noted above.  Physical Exam    VS:  BP 128/72   Pulse 72   Ht 5\' 7"  (1.702 m)   Wt 241 lb (109.3 kg)   SpO2  98%   BMI 37.75 kg/m  , BMI Body mass index is 37.75 kg/m.  Wt Readings from Last 3 Encounters:  02/11/23 241 lb (109.3 kg)  09/10/22 250 lb (113.4 kg)  06/12/22 247 lb (112 kg)    GEN: Obese, 42 year old female in no acute distress. HEENT: normal. Neck: Supple, no JVD, carotid bruits, or masses. Cardiac: S1/S2, RRR, no murmurs, rubs, or gallops. No clubbing, cyanosis.  Nonpitting edema, trace along BLE.  Radials/PT 2+ and equal bilaterally.  Respiratory:  Respirations regular and unlabored, clear to auscultation bilaterally. GI: Soft, nontender, nondistended. MS: No deformity or  atrophy. Skin: Warm and dry, no rash. Neuro:  Strength and sensation are intact. Psych: Normal affect.  Assessment & Plan    Atypical chest pain Very atypical in etiology, sounds to be MSK related after recent PNA. Will continue to monitor at this time. No indication for ischemic evaluation. CT cardiac scoring 04/2021 was 0 - see results above. Continue current medication regimen. Care and ED precautions discussed. Continue to follow with PCP.   Hypertension Blood pressure stable, discussed SBP goal < 130. Continue medication regimen. Discussed to monitor BP at home at least 2 hours after medications and sitting for 5-10 minutes.  No medication changes at this time.  Heart healthy diet and regular cardiovascular exercise encouraged.   HLD, hypertriglyceridemia, elevated liver function enzymes LDL panel 04/2022 revealed TC 283, TG 348, HDL 55, and LDL 162. Previous intolerance to statin. Currently being managed by PCP, only on red yeast rice. States she has upcoming labs and declines further medical management until labs are obtained by PCP. Discussed lifestyle modifications. Heart healthy diet and regular cardiovascular exercise encouraged. Will request labs at next office visit.   4. Obesity Weight loss via diet and exercise encouraged. Discussed the impact being overweight would have on cardiovascular risk.  Disposition: Will provide refill per her request. Follow up in 4-5 months with Charlton Haws, MD or APP.  Signed, Sharlene Dory, NP 02/11/2023, 3:12 PM [facility]

## 2023-04-21 ENCOUNTER — Other Ambulatory Visit: Payer: Self-pay | Admitting: Nurse Practitioner

## 2023-04-21 DIAGNOSIS — E781 Pure hyperglyceridemia: Secondary | ICD-10-CM

## 2023-04-21 DIAGNOSIS — I10 Essential (primary) hypertension: Secondary | ICD-10-CM

## 2023-04-21 DIAGNOSIS — E782 Mixed hyperlipidemia: Secondary | ICD-10-CM

## 2023-05-04 ENCOUNTER — Ambulatory Visit
Admission: EM | Admit: 2023-05-04 | Discharge: 2023-05-04 | Disposition: A | Discharge status: Home / Self Care | Attending: Nurse Practitioner | Admitting: Nurse Practitioner

## 2023-05-04 ENCOUNTER — Encounter: Payer: Self-pay | Admitting: Emergency Medicine

## 2023-05-04 DIAGNOSIS — L732 Hidradenitis suppurativa: Secondary | ICD-10-CM | POA: Diagnosis not present

## 2023-05-04 MED ORDER — DOXYCYCLINE HYCLATE 100 MG PO TABS: 100.0000 mg | ORAL_TABLET | Freq: Two times a day (BID) | ORAL | 0 refills | Status: AC

## 2023-05-04 NOTE — ED Provider Notes (Signed)
 [facility]    CSN: [id number] Arrival date & time: 05/04/23  1652      History   Chief Complaint Chief Complaint  Patient presents with   boils in right armpit area    HPI Emily Colon is a 43 y.o. female.   The history is provided by the patient.   Patient presents for complaints for concern of "a cluster of boils" under her right arm.  Patient states that she initially thought it was due to using a new razor.  States that she was seen by her OB who told her that she most likely has hidradenitis.  Patient states she has been using warm compresses to the area and cleansing the areas with Hibiclens soap.  She states that the areas have improved, but continued to remain painful.  She states that she has had a fever recently, but also states that she has had some upper respiratory symptoms.  Denies chest pain, abdominal pain, nausea, vomiting, diarrhea, or rash. Past Medical History:  Diagnosis Date   Hypertension    Kidney stone    PONV (postoperative nausea and vomiting)     Patient Active Problem List   Diagnosis Date Noted   Hypertension 05/08/2021   Hyperlipidemia 05/08/2021   Elevated blood pressure reading 07/02/2016   Elevated LFTs 04/15/2016   Family history of colonic polyps 04/15/2016   Rectal bleeding 04/15/2016   POLYCYSTIC OVARIAN DISEASE 12/20/2006   CARPAL TUNNEL SYNDROME 12/20/2006    Past Surgical History:  Procedure Laterality Date   CARPAL TUNNEL RELEASE  2005 and 2006   bilateral    also cubital tunnel   CHOLECYSTECTOMY  11/07/2010   Procedure: LAPAROSCOPIC CHOLECYSTECTOMY;  Surgeon: Fabio Bering;  Location: [facility];  Service: General;  Laterality: N/A;   COLONOSCOPY N/A 04/30/2016   Procedure: COLONOSCOPY;  Surgeon: West Bali, MD;  Location: [facility];  Service: Endoscopy;  Laterality: N/A;  9:45am   toth removal Right    URETHRA SURGERY  age 32   WISDOM TOOTH EXTRACTION  02/24/1999    OB History   No obstetric  history on file.      Home Medications    Prior to Admission medications   Medication Sig Start Date End Date Taking? Authorizing Provider  amLODipine (NORVASC) 10 MG tablet Take 1 tablet (10 mg total) by mouth daily. 09/10/22   Sharlene Dory, NP  benzonatate (TESSALON) 200 MG capsule Take 200 mg by mouth 3 (three) times daily as needed for cough.    [provider]  levonorgestrel (MIRENA, 52 MG,) 20 MCG/DAY IUD Mirena 21 mcg/24 hours (8 yrs) 52 mg intrauterine device  Take 1 device by intrauterine route.    [provider]  metFORMIN (GLUCOPHAGE-XR) 500 MG 24 hr tablet Take 1,000 mg by mouth daily. 10/06/21   [provider]  olmesartan-hydrochlorothiazide (BENICAR HCT) 40-12.5 MG tablet Take 1 tablet by mouth daily. 02/11/23   Sharlene Dory, NP  Turmeric (QC TUMERIC COMPLEX PO) Take 1 tablet by mouth daily.    [provider]    Family History Family History  Problem Relation Age of Onset   Prostate cancer Father    Breast cancer Maternal Grandfather    Anesthesia problems Neg Hx    Hypotension Neg Hx    Malignant hyperthermia Neg Hx    Pseudochol deficiency Neg Hx    Colon cancer Neg Hx     Social History Social History   Tobacco Use   Smoking  status: Never    Passive exposure: Never   Smokeless tobacco: Never  Vaping Use   Vaping status: Never Used  Substance Use Topics   Alcohol use: No   Drug use: No     Allergies   Atorvastatin   Review of Systems Review of Systems Per HPI  Physical Exam Triage Vital Signs ED Triage Vitals  Encounter Vitals Group     BP 05/04/23 1703 (!) 149/97     Systolic BP Percentile --      Diastolic BP Percentile --      Pulse Rate 05/04/23 1703 (!) 113     Resp 05/04/23 1703 18     Temp 05/04/23 1703 97.9 F (36.6 C)     Temp Source 05/04/23 1703 Oral     SpO2 05/04/23 1703 98 %     Weight --      Height --      Head Circumference --      Peak Flow --      Pain Score 05/04/23  1705 7     Pain Loc --      Pain Education --      Exclude from Growth Chart --    No data found.  Updated Vital Signs BP (!) 149/97 (BP Location: Right Arm)   Pulse (!) 113   Temp 97.9 F (36.6 C) (Oral)   Resp 18   SpO2 98%   Visual Acuity Right Eye Distance:   Left Eye Distance:   Bilateral Distance:    Right Eye Near:   Left Eye Near:    Bilateral Near:     Physical Exam Vitals and nursing note reviewed.  Constitutional:      General: She is not in acute distress.    Appearance: Normal appearance.  HENT:     Head: Normocephalic.  Eyes:     Extraocular Movements: Extraocular movements intact.     Pupils: Pupils are equal, round, and reactive to light.  Pulmonary:     Effort: Pulmonary effort is normal.  Musculoskeletal:     Cervical back: Normal range of motion.  Lymphadenopathy:     Upper Body:     Right upper body: No supraclavicular or axillary adenopathy.  Skin:    General: Skin is warm and dry.  Neurological:     General: No focal deficit present.     Mental Status: She is alert and oriented to person, place, and time.  Psychiatric:        Mood and Affect: Mood normal.        Behavior: Behavior normal.      UC Treatments / Results  Labs (all labs ordered are listed, but only abnormal results are displayed) Labs Reviewed - No data to display  EKG   Radiology No results found.  Procedures Procedures (including critical care time)  Medications Ordered in UC Medications - No data to display  Initial Impression / Assessment and Plan / UC Course  I have reviewed the triage vital signs and the nursing notes.  Pertinent labs & imaging results that were available during my care of the patient were reviewed by me and considered in my medical decision making (see chart for details).  Symptoms are consistent with hidradenitis.  Patient with 5-6 indurations noted under the right axilla.  Indurations range from 0.5 cm up to 1.5 cm.  Some areas are  tender to palpation.  Will start doxycycline 100 mg twice daily for the next 5 days.  Supportive  care recommendations were provided and discussed with the patient to include over-the-counter analgesics, continuing to use the Hibiclens soap, and warm compresses.  Discussed indications with patient regarding follow-up.  Patient was in agreement with this plan of care and verbalized understanding.  All questions were answered.  Patient stable for discharge.  Final Clinical Impressions(s) / UC Diagnoses   Final diagnoses:  None   Discharge Instructions   None    ED Prescriptions   None    PDMP not reviewed this encounter.   Abran Cantor, NP 05/04/23 1725

## 2023-05-04 NOTE — Discharge Instructions (Addendum)
 Take medication as prescribed. Warm compresses to the affected area 3-4 times daily. Continue using the Hibiclens soap.  You can also clean the areas at least twice daily with Dial Gold bar soap. Keep the area covered if they begin to drain. Follow-up in this clinic if you continue to experience pain is, increased swelling, or if the areas are failing to improve. Go to the emergency department if you develop fever, chills, generalized fatigue, nausea, vomiting, or if the area of redness spreads into the genital region, or if you have foul-smelling drainage.  Follow-up as needed.

## 2023-05-04 NOTE — ED Triage Notes (Signed)
 Cluster of boils in right armpit x 2 weeks.  States area has been draining.

## 2023-05-14 ENCOUNTER — Encounter: Payer: Self-pay | Admitting: Nurse Practitioner

## 2023-05-14 ENCOUNTER — Ambulatory Visit: Payer: BC Managed Care – PPO | Attending: Nurse Practitioner | Admitting: Nurse Practitioner

## 2023-05-14 VITALS — BP 141/98 | HR 83 | Resp 98 | Ht 67.0 in | Wt 239.8 lb

## 2023-05-14 DIAGNOSIS — I445 Left posterior fascicular block: Secondary | ICD-10-CM

## 2023-05-14 DIAGNOSIS — I10 Essential (primary) hypertension: Secondary | ICD-10-CM

## 2023-05-14 DIAGNOSIS — R0789 Other chest pain: Secondary | ICD-10-CM

## 2023-05-14 DIAGNOSIS — E781 Pure hyperglyceridemia: Secondary | ICD-10-CM

## 2023-05-14 DIAGNOSIS — E785 Hyperlipidemia, unspecified: Secondary | ICD-10-CM | POA: Diagnosis not present

## 2023-05-14 DIAGNOSIS — R7989 Other specified abnormal findings of blood chemistry: Secondary | ICD-10-CM

## 2023-05-14 DIAGNOSIS — E669 Obesity, unspecified: Secondary | ICD-10-CM

## 2023-05-14 NOTE — Patient Instructions (Addendum)
 Medication Instructions:   Continue all current medications.   Labwork:  none  Testing/Procedures:  Your physician has requested that you have an echocardiogram. Echocardiography is a painless test that uses sound waves to create images of your heart. It provides your doctor with information about the size and shape of your heart and how well your heart's chambers and valves are working. This procedure takes approximately one hour. There are no restrictions for this procedure. Please do NOT wear cologne, perfume, aftershave, or lotions (deodorant is allowed). Please arrive 15 minutes prior to your appointment time.  Please note: We ask at that you not bring children with you during ultrasound (echo/ vascular) testing. Due to room size and safety concerns, children are not allowed in the ultrasound rooms during exams. Our front office staff cannot provide observation of children in our lobby area while testing is being conducted. An adult accompanying a patient to their appointment will only be allowed in the ultrasound room at the discretion of the ultrasound technician under special circumstances. We apologize for any inconvenience. Office will contact with results via phone, letter or mychart.     Follow-Up:  4-6 months   Any Other Special Instructions Will Be Listed Below (If Applicable).  BP log Sal[place] six  Nurse BP check in 2-3 weeks   If you need a refill on your cardiac medications before your next appointment, please call your pharmacy.

## 2023-05-14 NOTE — Progress Notes (Signed)
 Office Visit    Patient Name: Emily Colon Date of Encounter: 05/14/2023 PCP:  Benita Stabile, MD [facility]  Cardiologist:  Charlton Haws, MD  Advanced Practice Provider:  No care team member to display Electrophysiologist:  None 46}  Chief Complaint and HPI    Emily Colon is a very pleasant 43 y.o. female with a hx of hypertension, history of rectal bleeding, PCOS, carpal tunnel syndrome, dizziness, HLD, hypertriglyceridemia, obesity, and elevated LFTs, who presents today for scheduled follow-up.  Last saw her for follow-up on September 10, 2022. Was doing very well.   02/11/2023 - Today she presents for follow-up. Admits to recent atypical chest pain during time she had pneumonia, has just completed ABX therapy for this. Describes CP as sharp, tender to touch along central chest, did note episodes of coughing. She also wonders if CP is related to her neck pain, as occurred together. Describes CP episodes as very brief in duration. Has gotten better over time, currently has tongue pain, believes she has thrush. Denies any shortness of breath, syncope, presyncope, dizziness, orthopnea, PND, swelling or significant weight changes, acute bleeding, or claudication.  05/14/2023 -presents today for follow-up. Continues to note some episodes of CP since last office visit.  Had an episode this past week located in the middle of her chest, says she has recently completed antibiotic therapy and episodes seem to be better over time per her report.  Has BPs at home averaging 130s. Overall says she is doing well. Denies any shortness of breath, palpitations, syncope, presyncope, dizziness, orthopnea, PND, swelling or significant weight changes, acute bleeding, or claudication.  SH: Works at [employer], has recently traveled to [place], [place] for work.  FH: Her father has history of prior stroke, history of high blood pressure.  Family history significant for  high blood pressure  EKGs/Labs/Other Studies Reviewed:   The following studies were reviewed today:   EKG: EKG is not ordered today.  EKG dated February 11, 2023 reviewed and revealed normal sinus rhythm, 84 bpm, left posterior fascicular block.      CT cardiac scoring 04/2021:  Coronary calcium score 0.  Echo 05/2017:  Study Conclusions   - Left ventricle: The cavity size was normal. Wall thickness was    increased in a pattern of mild LVH. Systolic function was normal.    The estimated ejection fraction was in the range of 55% to 60%.    Wall motion was normal; there were no regional wall motion    abnormalities. Left ventricular diastolic function parameters    were normal for the patient&'s age.  - Mitral valve: There was trivial regurgitation.  - Right atrium: Central venous pressure (est): 3 mm Hg.  - Tricuspid valve: There was trivial regurgitation.  - Pulmonary arteries: Systolic pressure could not be accurately    estimated.  - Pericardium, extracardiac: A prominent pericardial fat pad was    present.   Risk Assessment/Calculations:   The 10-year ASCVD risk score (Arnett DK, et al., 2019) is: 2.4%   Values used to calculate the score:     Age: 15 years     Sex: Female     Is Non-Hispanic African American: No     Diabetic: Yes     Tobacco smoker: No     Systolic Blood Pressure: 141 mmHg     Is BP treated: Yes     HDL Cholesterol: 47 mg/dL     Total Cholesterol: 194  mg/dL  Review of Systems    All other systems reviewed and are otherwise negative except as noted above.  Physical Exam    VS:  BP (!) 141/98 (BP Location: Right Arm, Cuff Size: Large)   Pulse 83   Resp (!) 98   Ht 5\' 7"  (1.702 m)   Wt 239 lb 12.8 oz (108.8 kg)   BMI 37.56 kg/m  , BMI Body mass index is 37.56 kg/m.  Wt Readings from Last 3 Encounters:  05/14/23 239 lb 12.8 oz (108.8 kg)  02/11/23 241 lb (109.3 kg)  09/10/22 250 lb (113.4 kg)   Manual BP - 130/86   GEN: Obese,  43 year old female in no acute distress. HEENT: normal. Neck: Supple, no JVD, carotid bruits, or masses. Cardiac: S1/S2, RRR, no murmurs, rubs, or gallops. No clubbing, cyanosis.  Nonpitting edema, trace along BLE.  Radials/PT 2+ and equal bilaterally.  Respiratory:  Respirations regular and unlabored, clear to auscultation bilaterally. GI: Soft, nontender, nondistended. MS: No deformity or atrophy. Skin: Warm and dry, no rash. Neuro:  Strength and sensation are intact. Psych: Normal affect.  Assessment & Plan    Atypical chest pain Very atypical in etiology, getting better per her report. Possibly related to having PNA in December 2024. Has sounded to be MSK related. Will continue to monitor at this time. No indication for ischemic evaluation. Will arrange Echo for further evaluation. CT cardiac scoring 04/2021 was 0 - see results above. Continue current medication regimen. Care and ED precautions discussed. Continue to follow with PCP.  2. LPFB Etiology unclear.  Cardiac scoring in March 2023 was 0.  Arranging echocardiogram as mentioned above.  No medication changes at this time.  Care and ED precautions discussed.  3. Hypertension Blood pressure 130/86 today with manual cuff, discussed SBP goal < 130. Continue medication regimen. Discussed to monitor BP at home at least 2 hours after medications and sitting for 5-10 minutes.  No medication changes at this time.  Heart healthy diet and regular cardiovascular exercise encouraged.  Will bring her back in 2 to 3 weeks for BP check.  If SBP is not at goal, plan to start low-dose carvedilol at 3.125 mg twice daily.  4. HLD, hypertriglyceridemia, elevated liver function enzymes LDL panel 04/2022 revealed TC 283, TG 348, HDL 55, and LDL 162. Previous intolerance to statin. Currently being managed by PCP, only on red yeast rice.  Previously arranged labs, patient says she will have these done soon.  Discussed lifestyle modifications. Heart healthy  diet and regular cardiovascular exercise encouraged.   5. Obesity Weight loss via diet and exercise encouraged. Discussed the impact being overweight would have on cardiovascular risk.  Disposition:  Follow up in 4-6 months with Charlton Haws, MD or APP.  Signed, Sharlene Dory, NP 05/14/2023, 2:12 PM [facility]

## 2023-06-03 ENCOUNTER — Ambulatory Visit: Attending: Nurse Practitioner

## 2023-06-03 ENCOUNTER — Ambulatory Visit (INDEPENDENT_AMBULATORY_CARE_PROVIDER_SITE_OTHER)

## 2023-06-03 ENCOUNTER — Telehealth: Payer: Self-pay | Admitting: Nurse Practitioner

## 2023-06-03 DIAGNOSIS — R0789 Other chest pain: Secondary | ICD-10-CM | POA: Diagnosis not present

## 2023-06-03 DIAGNOSIS — I10 Essential (primary) hypertension: Secondary | ICD-10-CM

## 2023-06-03 NOTE — Progress Notes (Signed)
 Patient here today for nurse visit for BP check per peck Patient states she feels good no CP, SOB or weakness  BP today  128/74 HR77  [id number]   Sent to Provider

## 2023-06-03 NOTE — Telephone Encounter (Signed)
-----   Message from Sharlene Dory sent at 06/03/2023 11:45 AM EDT ----- Larwance Rote reading! No changes to treatment plan.   Thanks!   Best, Sharlene Dory, NP ----- Message ----- From: Sharen Hones Sent: 06/03/2023   8:27 AM EDT To: Sharlene Dory, NP

## 2023-06-03 NOTE — Telephone Encounter (Signed)
 Patient informed and verbalized understanding of plan.

## 2023-06-04 LAB — ECHOCARDIOGRAM COMPLETE
AR max vel: 2.36 cm2
AV Area VTI: 2.02 cm2
AV Area mean vel: 2.17 cm2
AV Mean grad: 5 mmHg
AV Peak grad: 8.8 mmHg
Ao pk vel: 1.48 m/s
Area-P 1/2: 5.02 cm2
Calc EF: 52.6 %
MV VTI: 2.35 cm2
S' Lateral: 3.3 cm
Single Plane A2C EF: 50.6 %
Single Plane A4C EF: 56.7 %

## 2023-08-02 ENCOUNTER — Other Ambulatory Visit: Payer: Self-pay | Admitting: Obstetrics and Gynecology

## 2023-08-02 DIAGNOSIS — R928 Other abnormal and inconclusive findings on diagnostic imaging of breast: Secondary | ICD-10-CM

## 2023-08-10 ENCOUNTER — Ambulatory Visit
Admission: RE | Admit: 2023-08-10 | Discharge: 2023-08-10 | Disposition: A | Discharge status: Home / Self Care | Source: Ambulatory Visit | Attending: Obstetrics and Gynecology | Admitting: Obstetrics and Gynecology

## 2023-08-10 ENCOUNTER — Ambulatory Visit

## 2023-08-10 DIAGNOSIS — R928 Other abnormal and inconclusive findings on diagnostic imaging of breast: Secondary | ICD-10-CM

## 2023-08-18 ENCOUNTER — Other Ambulatory Visit

## 2023-08-18 ENCOUNTER — Encounter

## 2023-10-07 ENCOUNTER — Other Ambulatory Visit (HOSPITAL_COMMUNITY): Payer: Self-pay

## 2023-10-07 DIAGNOSIS — K7689 Other specified diseases of liver: Secondary | ICD-10-CM

## 2023-10-15 ENCOUNTER — Encounter: Payer: Self-pay | Admitting: Nurse Practitioner

## 2023-10-15 ENCOUNTER — Ambulatory Visit: Attending: Nurse Practitioner | Admitting: Nurse Practitioner

## 2023-10-15 VITALS — BP 142/88 | HR 97 | Ht 67.0 in | Wt 247.0 lb

## 2023-10-15 DIAGNOSIS — E669 Obesity, unspecified: Secondary | ICD-10-CM

## 2023-10-15 DIAGNOSIS — E781 Pure hyperglyceridemia: Secondary | ICD-10-CM

## 2023-10-15 DIAGNOSIS — I10 Essential (primary) hypertension: Secondary | ICD-10-CM | POA: Diagnosis not present

## 2023-10-15 DIAGNOSIS — E785 Hyperlipidemia, unspecified: Secondary | ICD-10-CM | POA: Diagnosis not present

## 2023-10-15 NOTE — Progress Notes (Signed)
 Office Visit    Patient Name: Emily Colon Date of Encounter: 10/15/2023 PCP:  Shona Norleen PEDLAR, MD [facility]  Cardiologist:  Maude Emmer, MD  Advanced Practice Provider:  No care team member to display Electrophysiologist:  None 46}  Chief Complaint and HPI    Emily Colon is a very pleasant 43 y.o. female with a hx of hypertension, history of rectal bleeding, PCOS, carpal tunnel syndrome, dizziness, HLD, hypertriglyceridemia, obesity, and elevated LFTs, who presents today for scheduled follow-up.  Last saw her for follow-up on September 10, 2022. Was doing very well.   02/11/2023 - Today she presents for follow-up. Admits to recent atypical chest pain during time she had pneumonia, has just completed ABX therapy for this. Describes CP as sharp, tender to touch along central chest, did note episodes of coughing. She also wonders if CP is related to her neck pain, as occurred together. Describes CP episodes as very brief in duration. Has gotten better over time, currently has tongue pain, believes she has thrush. Denies any shortness of breath, syncope, presyncope, dizziness, orthopnea, PND, swelling or significant weight changes, acute bleeding, or claudication.  05/14/2023 -presents today for follow-up. Continues to note some episodes of CP since last office visit.  Had an episode this past week located in the middle of her chest, says she has recently completed antibiotic therapy and episodes seem to be better over time per her report.  Has BPs at home averaging 130s. Overall says she is doing well. Denies any shortness of breath, palpitations, syncope, presyncope, dizziness, orthopnea, PND, swelling or significant weight changes, acute bleeding, or claudication.  10/15/2023 -  Doing very well. Does show me her labs recently drawn, 09/28/2023. Shows total cholesterol 286, triglycerides 307, HDL 48, LDL 179.  AST 47, ALT 47, hemoglobin A1c 10.5%, normal CBC, normal  kidney function and electrolytes. Denies any chest pain, shortness of breath, palpitations, syncope, presyncope, dizziness, orthopnea, PND, swelling or significant weight changes, acute bleeding, or claudication.  She tells me high cholesterol runs in her family.  SH: Works at [employer], has recently traveled to [place], [employer]  for work.  FH: Her father has history of prior stroke, history of high blood pressure.  Family history significant for high blood pressure  EKGs/Labs/Other Studies Reviewed:   The following studies were reviewed today:   EKG:  EKG Interpretation Date/Time:  Friday October 15 2023 15:04:56 EDT Ventricular Rate:  97 PR Interval:  162 QRS Duration:  96 QT Interval:  366 QTC Calculation: 464 R Axis:   125  Text Interpretation: Normal sinus rhythm Left posterior fascicular block Septal infarct , age undetermined When compared with ECG of 11-Feb-2023 10:34, No significant change was found Confirmed by Miriam Norris [id number]) on 10/15/2023 3:06:24 PM   Echo 05/2023:  1. Left ventricular ejection fraction, by estimation, is 50 to 55%. Left  ventricular ejection fraction by 3D volume is 51 %. The left ventricle has  low normal function. The left ventricle has no regional wall motion  abnormalities. Left ventricular  diastolic parameters were normal. The average left ventricular global  longitudinal strain is -19.8 %. The global longitudinal strain is normal.   2. Right ventricular systolic function is normal. The right ventricular  size is normal. Tricuspid regurgitation signal is inadequate for assessing  PA pressure.   3. The mitral valve is normal in structure. Trivial mitral valve  regurgitation. No evidence of mitral stenosis.   4. The aortic valve  has an indeterminant number of cusps. Aortic valve  regurgitation is not visualized. No aortic stenosis is present.   5. The inferior vena cava is normal in size with greater than 50%  respiratory  variability, suggesting right atrial pressure of 3 mmHg.   Comparison(s): A prior study was performed on 05/27/2017. No significant  change from prior study. EF 55-60%. Mild LVH.  CT cardiac scoring 04/2021:  Coronary calcium  score 0.  Echo 05/2017:  Study Conclusions   - Left ventricle: The cavity size was normal. Wall thickness was    increased in a pattern of mild LVH. Systolic function was normal.    The estimated ejection fraction was in the range of 55% to 60%.    Wall motion was normal; there were no regional wall motion    abnormalities. Left ventricular diastolic function parameters    were normal for the patient&'s age.  - Mitral valve: There was trivial regurgitation.  - Right atrium: Central venous pressure (est): 3 mm Hg.  - Tricuspid valve: There was trivial regurgitation.  - Pulmonary arteries: Systolic pressure could not be accurately    estimated.  - Pericardium, extracardiac: A prominent pericardial fat pad was    present.   Risk Assessment/Calculations:   The 10-year ASCVD risk score (Arnett DK, et al., 2019) is: 5.2%   Values used to calculate the score:     Age: 64 years     Clincally relevant sex: Female     Is Non-Hispanic African American: No     Diabetic: Yes     Tobacco smoker: No     Systolic Blood Pressure: 142 mmHg     Is BP treated: Yes     HDL Cholesterol: 48 mg/dL     Total Cholesterol: 286 mg/dL  Review of Systems    All other systems reviewed and are otherwise negative except as noted above.  Physical Exam    VS:  BP (!) 142/88 (BP Location: Left Arm)   Pulse 97   Ht 5' 7 (1.702 m)   Wt 247 lb (112 kg)   SpO2 98%   BMI 38.69 kg/m  , BMI Body mass index is 38.69 kg/m.  Wt Readings from Last 3 Encounters:  10/15/23 247 lb (112 kg)  05/14/23 239 lb 12.8 oz (108.8 kg)  02/11/23 241 lb (109.3 kg)    GEN: Obese, 43 year old female in no acute distress. HEENT: normal. Neck: Supple, no JVD, carotid bruits, or masses. Cardiac:  S1/S2, RRR, no murmurs, rubs, or gallops. No clubbing, cyanosis.  Nonpitting edema, trace along BLE.  Radials/PT 2+ and equal bilaterally.  Respiratory:  Respirations regular and unlabored, clear to auscultation bilaterally. GI: Soft, nontender, nondistended. MS: No deformity or atrophy. Skin: Warm and dry, no rash. Neuro:  Strength and sensation are intact. Psych: Normal affect.  Assessment & Plan      Hypertension Blood pressure elevated today, discussed SBP goal < 130.  She will monitor her blood pressure readings and update us  in the next 2 to 3 weeks with her blood pressure readings and let us  know SBP is consistently elevated above 130.  She verbalized understanding.  Continue medication regimen. Discussed to monitor BP at home at least 2 hours after medications and sitting for 5-10 minutes.  No medication changes at this time.  Heart healthy diet and regular cardiovascular exercise encouraged.    2. HLD, hypertriglyceridemia, elevated liver function enzymes Recent labs reviewed-see HPI.  Previous intolerance to statin. Currently being managed by PCP.  Discussed lifestyle modifications and pt requests to work on this first. Consider PCSK9i/Leqvio/etc in no improvement through lifestyle modifications. Heart healthy diet and regular cardiovascular exercise encouraged. Encouraged her to see PCP for evaluation.   3. Obesity Weight loss via diet and exercise encouraged. Discussed the impact being overweight would have on cardiovascular risk.  I spent a total duration of 30 minutes reviewing prior notes, reviewing outside records including  labs, EKG today, face-to-face counseling of medical condition, pathophysiology, evaluation, management, and documenting the findings in the note.   Disposition:  Follow up in 1 year with Maude Emmer, MD or APP.  Signed, Almarie Crate, NP

## 2023-10-15 NOTE — Patient Instructions (Addendum)

## 2024-02-12 ENCOUNTER — Other Ambulatory Visit: Payer: Self-pay | Admitting: Nurse Practitioner
